# Patient Record
Sex: Female | Born: 1970 | Race: White | Hispanic: No | Marital: Married | Smoking: Never smoker
Health system: Southern US, Community
[De-identification: ages and names within clinical notes are randomized; demographics above are authoritative.]

## PROBLEM LIST (undated history)

## (undated) DIAGNOSIS — T7840XA Allergy, unspecified, initial encounter: Secondary | ICD-10-CM

## (undated) DIAGNOSIS — F419 Anxiety disorder, unspecified: Secondary | ICD-10-CM

## (undated) DIAGNOSIS — I10 Essential (primary) hypertension: Secondary | ICD-10-CM

## (undated) DIAGNOSIS — B379 Candidiasis, unspecified: Secondary | ICD-10-CM

## (undated) DIAGNOSIS — K219 Gastro-esophageal reflux disease without esophagitis: Secondary | ICD-10-CM

## (undated) DIAGNOSIS — D649 Anemia, unspecified: Secondary | ICD-10-CM

## (undated) DIAGNOSIS — F32A Depression, unspecified: Secondary | ICD-10-CM

## (undated) DIAGNOSIS — B019 Varicella without complication: Secondary | ICD-10-CM

## (undated) DIAGNOSIS — F329 Major depressive disorder, single episode, unspecified: Secondary | ICD-10-CM

## (undated) DIAGNOSIS — Z8719 Personal history of other diseases of the digestive system: Secondary | ICD-10-CM

## (undated) HISTORY — PX: WISDOM TOOTH EXTRACTION: SHX21

## (undated) HISTORY — DX: Allergy, unspecified, initial encounter: T78.40XA

## (undated) HISTORY — DX: Anemia, unspecified: D64.9

## (undated) HISTORY — DX: Personal history of other diseases of the digestive system: Z87.19

## (undated) HISTORY — DX: Candidiasis, unspecified: B37.9

## (undated) HISTORY — DX: Varicella without complication: B01.9

---

## 1998-03-20 ENCOUNTER — Other Ambulatory Visit: Admission: RE | Admit: 1998-03-20 | Discharge: 1998-03-20 | Payer: Self-pay | Admitting: Obstetrics and Gynecology

## 2000-04-15 ENCOUNTER — Other Ambulatory Visit: Admission: RE | Admit: 2000-04-15 | Discharge: 2000-04-15 | Payer: Self-pay | Admitting: Obstetrics and Gynecology

## 2000-10-29 ENCOUNTER — Inpatient Hospital Stay (HOSPITAL_COMMUNITY): Admission: AD | Admit: 2000-10-29 | Discharge: 2000-10-29 | Payer: Self-pay | Admitting: Obstetrics and Gynecology

## 2000-10-31 ENCOUNTER — Inpatient Hospital Stay (HOSPITAL_COMMUNITY): Admission: AD | Admit: 2000-10-31 | Discharge: 2000-11-03 | Payer: Self-pay | Admitting: Obstetrics and Gynecology

## 2000-11-13 ENCOUNTER — Encounter: Admission: RE | Admit: 2000-11-13 | Discharge: 2000-12-13 | Payer: Self-pay | Admitting: Obstetrics and Gynecology

## 2001-12-22 ENCOUNTER — Other Ambulatory Visit: Admission: RE | Admit: 2001-12-22 | Discharge: 2001-12-22 | Payer: Self-pay | Admitting: Obstetrics and Gynecology

## 2002-12-28 ENCOUNTER — Other Ambulatory Visit: Admission: RE | Admit: 2002-12-28 | Discharge: 2002-12-28 | Payer: Self-pay | Admitting: Obstetrics and Gynecology

## 2004-03-22 ENCOUNTER — Other Ambulatory Visit: Admission: RE | Admit: 2004-03-22 | Discharge: 2004-03-22 | Payer: Self-pay | Admitting: Obstetrics and Gynecology

## 2005-04-10 ENCOUNTER — Other Ambulatory Visit: Admission: RE | Admit: 2005-04-10 | Discharge: 2005-04-10 | Payer: Self-pay | Admitting: Obstetrics and Gynecology

## 2006-03-31 ENCOUNTER — Encounter: Admission: RE | Admit: 2006-03-31 | Discharge: 2006-03-31 | Payer: Self-pay | Admitting: Internal Medicine

## 2006-04-04 DIAGNOSIS — Z8719 Personal history of other diseases of the digestive system: Secondary | ICD-10-CM

## 2006-04-04 HISTORY — DX: Personal history of other diseases of the digestive system: Z87.19

## 2006-04-17 ENCOUNTER — Other Ambulatory Visit: Admission: RE | Admit: 2006-04-17 | Discharge: 2006-04-17 | Payer: Self-pay | Admitting: Obstetrics and Gynecology

## 2011-09-23 DIAGNOSIS — N882 Stricture and stenosis of cervix uteri: Secondary | ICD-10-CM

## 2011-10-01 ENCOUNTER — Encounter (HOSPITAL_COMMUNITY): Payer: Self-pay

## 2011-10-01 ENCOUNTER — Emergency Department (HOSPITAL_COMMUNITY)
Admission: EM | Admit: 2011-10-01 | Discharge: 2011-10-01 | Disposition: A | Discharge status: Home / Self Care | Payer: No Typology Code available for payment source | Attending: Emergency Medicine | Admitting: Emergency Medicine

## 2011-10-01 DIAGNOSIS — S139XXA Sprain of joints and ligaments of unspecified parts of neck, initial encounter: Secondary | ICD-10-CM | POA: Insufficient documentation

## 2011-10-01 DIAGNOSIS — S161XXA Strain of muscle, fascia and tendon at neck level, initial encounter: Secondary | ICD-10-CM

## 2011-10-01 HISTORY — DX: Anxiety disorder, unspecified: F41.9

## 2011-10-01 HISTORY — DX: Major depressive disorder, single episode, unspecified: F32.9

## 2011-10-01 HISTORY — DX: Depression, unspecified: F32.A

## 2011-10-01 MED ORDER — DIAZEPAM 5 MG PO TABS: 5.0000 mg | ORAL_TABLET | Freq: Three times a day (TID) | ORAL | Status: AC | PRN

## 2011-10-01 MED ORDER — IBUPROFEN 800 MG PO TABS: 800.0000 mg | ORAL_TABLET | Freq: Three times a day (TID) | ORAL | Status: AC
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
## 2011-10-01 NOTE — ED Provider Notes (Signed)
Medical screening examination/treatment/procedure(s) were performed by non-physician practitioner and as supervising physician I was immediately available for consultation/collaboration. Devoria Albe, MD, Armando Gang   Ward Givens, MD 10/01/11 [phone]
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
## 2011-10-22 ENCOUNTER — Ambulatory Visit (INDEPENDENT_AMBULATORY_CARE_PROVIDER_SITE_OTHER): Payer: BC Managed Care – PPO | Admitting: Obstetrics and Gynecology

## 2011-10-22 DIAGNOSIS — Z01419 Encounter for gynecological examination (general) (routine) without abnormal findings: Secondary | ICD-10-CM

## 2011-11-05 ENCOUNTER — Ambulatory Visit: Payer: Self-pay | Admitting: Obstetrics and Gynecology

## 2011-12-08 ENCOUNTER — Ambulatory Visit: Payer: BC Managed Care – HMO

## 2011-12-09 ENCOUNTER — Ambulatory Visit: Payer: BC Managed Care – HMO | Attending: Family Medicine | Admitting: Physical Therapy

## 2011-12-09 DIAGNOSIS — M542 Cervicalgia: Secondary | ICD-10-CM | POA: Insufficient documentation

## 2011-12-09 DIAGNOSIS — M25519 Pain in unspecified shoulder: Secondary | ICD-10-CM | POA: Insufficient documentation

## 2011-12-09 DIAGNOSIS — IMO0001 Reserved for inherently not codable concepts without codable children: Secondary | ICD-10-CM | POA: Insufficient documentation

## 2011-12-09 DIAGNOSIS — M25539 Pain in unspecified wrist: Secondary | ICD-10-CM | POA: Insufficient documentation

## 2011-12-16 ENCOUNTER — Ambulatory Visit: Payer: BC Managed Care – HMO | Admitting: Physical Therapy

## 2011-12-17 ENCOUNTER — Encounter: Payer: BC Managed Care – HMO | Admitting: Physical Therapy

## 2011-12-23 ENCOUNTER — Ambulatory Visit: Payer: BC Managed Care – HMO | Admitting: Physical Therapy

## 2011-12-25 ENCOUNTER — Ambulatory Visit: Payer: BC Managed Care – HMO | Admitting: Physical Therapy

## 2011-12-30 ENCOUNTER — Ambulatory Visit: Payer: BC Managed Care – HMO | Admitting: Physical Therapy

## 2012-01-01 ENCOUNTER — Ambulatory Visit: Payer: BC Managed Care – HMO | Admitting: Physical Therapy

## 2012-01-06 ENCOUNTER — Ambulatory Visit: Payer: No Typology Code available for payment source | Attending: Family Medicine | Admitting: Physical Therapy

## 2012-01-06 DIAGNOSIS — M542 Cervicalgia: Secondary | ICD-10-CM | POA: Insufficient documentation

## 2012-01-06 DIAGNOSIS — M25539 Pain in unspecified wrist: Secondary | ICD-10-CM | POA: Insufficient documentation

## 2012-01-06 DIAGNOSIS — M25519 Pain in unspecified shoulder: Secondary | ICD-10-CM | POA: Insufficient documentation

## 2012-01-06 DIAGNOSIS — IMO0001 Reserved for inherently not codable concepts without codable children: Secondary | ICD-10-CM | POA: Insufficient documentation

## 2012-01-08 ENCOUNTER — Ambulatory Visit: Payer: No Typology Code available for payment source | Admitting: Physical Therapy

## 2012-01-13 ENCOUNTER — Ambulatory Visit: Payer: No Typology Code available for payment source | Admitting: Physical Therapy

## 2012-01-15 ENCOUNTER — Ambulatory Visit: Payer: No Typology Code available for payment source | Admitting: Physical Therapy

## 2012-01-20 ENCOUNTER — Ambulatory Visit: Payer: No Typology Code available for payment source | Admitting: Physical Therapy

## 2012-01-22 ENCOUNTER — Ambulatory Visit: Payer: No Typology Code available for payment source | Admitting: Physical Therapy

## 2012-02-26 ENCOUNTER — Ambulatory Visit: Payer: BC Managed Care – HMO | Attending: Family Medicine | Admitting: Physical Therapy

## 2012-02-26 DIAGNOSIS — M25539 Pain in unspecified wrist: Secondary | ICD-10-CM | POA: Insufficient documentation

## 2012-02-26 DIAGNOSIS — IMO0001 Reserved for inherently not codable concepts without codable children: Secondary | ICD-10-CM | POA: Insufficient documentation

## 2012-02-26 DIAGNOSIS — M25519 Pain in unspecified shoulder: Secondary | ICD-10-CM | POA: Insufficient documentation

## 2012-02-26 DIAGNOSIS — M542 Cervicalgia: Secondary | ICD-10-CM | POA: Insufficient documentation

## 2012-03-04 ENCOUNTER — Ambulatory Visit: Payer: BC Managed Care – HMO | Attending: Family Medicine | Admitting: Physical Therapy

## 2012-03-04 DIAGNOSIS — IMO0001 Reserved for inherently not codable concepts without codable children: Secondary | ICD-10-CM | POA: Insufficient documentation

## 2012-03-04 DIAGNOSIS — M542 Cervicalgia: Secondary | ICD-10-CM | POA: Insufficient documentation

## 2012-03-04 DIAGNOSIS — M25519 Pain in unspecified shoulder: Secondary | ICD-10-CM | POA: Insufficient documentation

## 2012-03-04 DIAGNOSIS — M25539 Pain in unspecified wrist: Secondary | ICD-10-CM | POA: Insufficient documentation

## 2012-03-09 ENCOUNTER — Ambulatory Visit: Payer: BC Managed Care – HMO | Admitting: Physical Therapy

## 2012-03-11 ENCOUNTER — Encounter: Payer: BC Managed Care – HMO | Admitting: Physical Therapy

## 2012-03-18 ENCOUNTER — Ambulatory Visit: Payer: BC Managed Care – HMO | Admitting: Physical Therapy

## 2012-03-23 ENCOUNTER — Encounter: Payer: BC Managed Care – HMO | Admitting: Physical Therapy

## 2012-03-25 ENCOUNTER — Encounter: Payer: BC Managed Care – HMO | Admitting: Physical Therapy

## 2012-03-30 ENCOUNTER — Ambulatory Visit: Payer: BC Managed Care – HMO | Admitting: Physical Therapy

## 2012-04-01 ENCOUNTER — Encounter: Payer: BC Managed Care – HMO | Admitting: Physical Therapy

## 2012-04-01 ENCOUNTER — Ambulatory Visit: Payer: BC Managed Care – HMO | Admitting: Physical Therapy

## 2012-04-07 ENCOUNTER — Ambulatory Visit: Payer: BC Managed Care – HMO | Attending: Family Medicine | Admitting: Physical Therapy

## 2012-04-07 DIAGNOSIS — M25519 Pain in unspecified shoulder: Secondary | ICD-10-CM | POA: Insufficient documentation

## 2012-04-07 DIAGNOSIS — M25539 Pain in unspecified wrist: Secondary | ICD-10-CM | POA: Insufficient documentation

## 2012-04-07 DIAGNOSIS — IMO0001 Reserved for inherently not codable concepts without codable children: Secondary | ICD-10-CM | POA: Insufficient documentation

## 2012-04-07 DIAGNOSIS — M542 Cervicalgia: Secondary | ICD-10-CM | POA: Insufficient documentation

## 2012-04-08 ENCOUNTER — Encounter: Payer: BC Managed Care – HMO | Admitting: Physical Therapy

## 2012-04-12 ENCOUNTER — Encounter: Payer: BC Managed Care – HMO | Admitting: Physical Therapy

## 2012-04-15 ENCOUNTER — Ambulatory Visit: Payer: BC Managed Care – HMO | Admitting: Physical Therapy

## 2012-04-20 ENCOUNTER — Other Ambulatory Visit: Payer: Self-pay | Admitting: Family Medicine

## 2012-04-20 DIAGNOSIS — M542 Cervicalgia: Secondary | ICD-10-CM

## 2012-04-21 ENCOUNTER — Ambulatory Visit
Admission: RE | Admit: 2012-04-21 | Discharge: 2012-04-21 | Disposition: A | Discharge status: Home / Self Care | Payer: BC Managed Care – HMO | Source: Ambulatory Visit | Attending: Family Medicine | Admitting: Family Medicine

## 2012-04-21 DIAGNOSIS — M542 Cervicalgia: Secondary | ICD-10-CM

## 2012-06-14 ENCOUNTER — Encounter (HOSPITAL_COMMUNITY): Payer: Self-pay | Admitting: Pharmacy Technician

## 2012-06-14 ENCOUNTER — Other Ambulatory Visit (HOSPITAL_COMMUNITY): Payer: Self-pay | Admitting: Orthopaedic Surgery

## 2012-06-15 ENCOUNTER — Encounter (HOSPITAL_COMMUNITY): Payer: Self-pay

## 2012-06-15 ENCOUNTER — Encounter (HOSPITAL_COMMUNITY)
Admission: RE | Admit: 2012-06-15 | Discharge: 2012-06-15 | Disposition: A | Discharge status: Home / Self Care | Payer: BC Managed Care – PPO | Source: Ambulatory Visit | Attending: Orthopaedic Surgery | Admitting: Orthopaedic Surgery

## 2012-06-15 ENCOUNTER — Encounter (HOSPITAL_COMMUNITY)
Admission: RE | Admit: 2012-06-15 | Discharge: 2012-06-15 | Disposition: A | Discharge status: Home / Self Care | Payer: BC Managed Care – PPO | Source: Ambulatory Visit | Attending: Anesthesiology | Admitting: Anesthesiology

## 2012-06-15 HISTORY — DX: Essential (primary) hypertension: I10

## 2012-06-15 HISTORY — DX: Gastro-esophageal reflux disease without esophagitis: K21.9

## 2012-06-15 LAB — URINALYSIS, ROUTINE W REFLEX MICROSCOPIC
Glucose, UA: NEGATIVE mg/dL
Leukocytes, UA: NEGATIVE
Protein, ur: NEGATIVE mg/dL

## 2012-06-15 LAB — CBC
MCH: 30.3 pg (ref 26.0–34.0)
MCHC: 33.6 g/dL (ref 30.0–36.0)
Platelets: 318 10*3/uL (ref 150–400)
RDW: 12.1 % (ref 11.5–15.5)

## 2012-06-15 LAB — HCG, SERUM, QUALITATIVE: Preg, Serum: NEGATIVE

## 2012-06-15 LAB — COMPREHENSIVE METABOLIC PANEL
ALT: 18 U/L (ref 0–35)
AST: 17 U/L (ref 0–37)
Calcium: 9.6 mg/dL (ref 8.4–10.5)
GFR calc Af Amer: >90 mL/min (ref >90)
Sodium: 141 mEq/L (ref 135–145)
Total Protein: 7 g/dL (ref 6.0–8.3)

## 2012-06-15 LAB — SURGICAL PCR SCREEN: MRSA, PCR: NEGATIVE

## 2012-06-15 NOTE — Pre-Procedure Instructions (Addendum)
20 HALLEL DENHERDER  06/15/2012   Your procedure is scheduled on:  08/22/11  Report to [facility] at1030 AM.  Call this number if you have problems the morning of surgery: [phone]   Remember:   Do not eat food or drinkAfter Midnight.    Take these medicines the morning of surgery with A SIP OF WATER: pain med, celexa, clonidine  STOP cinnamon,flaxseed,naproxen now   Do not wear jewelry, make-up or nail polish.  Do not wear lotions, powders, or perfumes  Do not shave 48 hours prior to surgery. Men may shave face and neck.  Do not bring valuables to the hospital.  Contacts, dentures or bridgework may not be worn into surgery.  Leave suitcase in the car. After surgery it may be brought to your room.  For patients admitted to the hospital, checkout time is 11:00 AM the day of discharge.   Patients discharged the day of surgery will not be allowed to drive home.  Name and phone number of your driver: [phone] brian spouse  Special Instructions: Shower using CHG 2 nights before surgery and the night before surgery.  If you shower the day of surgery use CHG.  Use special wash - you have one bottle of CHG for all showers.  You should use approximately 1/3 of the bottle for each shower.   Please read over the following fact sheets that you were given: Pain Booklet, Coughing and Deep Breathing, MRSA Information and Surgical Site Infection Prevention
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
## 2012-06-20 MED ORDER — CEFAZOLIN SODIUM-DEXTROSE 2-3 GM-% IV SOLR
2.0000 g | INTRAVENOUS | Status: AC
  Administered 2012-06-21: 2 g via INTRAVENOUS
  Filled 2012-06-20: qty 50

## 2012-06-21 ENCOUNTER — Encounter (HOSPITAL_COMMUNITY): Payer: Self-pay | Admitting: Certified Registered"

## 2012-06-21 ENCOUNTER — Ambulatory Visit (HOSPITAL_COMMUNITY): Payer: BC Managed Care – PPO

## 2012-06-21 ENCOUNTER — Ambulatory Visit (HOSPITAL_COMMUNITY): Payer: BC Managed Care – PPO | Admitting: Certified Registered"

## 2012-06-21 ENCOUNTER — Encounter (HOSPITAL_COMMUNITY)
Admission: RE | Disposition: A | Discharge status: Home / Self Care | Payer: Self-pay | Source: Ambulatory Visit | Attending: Orthopaedic Surgery

## 2012-06-21 ENCOUNTER — Observation Stay (HOSPITAL_COMMUNITY)
Admission: RE | Admit: 2012-06-21 | Discharge: 2012-06-22 | DRG: 865 | Disposition: A | Discharge status: Home / Self Care | Payer: BC Managed Care – PPO | Source: Ambulatory Visit | Attending: Orthopaedic Surgery | Admitting: Orthopaedic Surgery

## 2012-06-21 DIAGNOSIS — I10 Essential (primary) hypertension: Secondary | ICD-10-CM | POA: Insufficient documentation

## 2012-06-21 DIAGNOSIS — Z01818 Encounter for other preprocedural examination: Secondary | ICD-10-CM | POA: Insufficient documentation

## 2012-06-21 DIAGNOSIS — Z01812 Encounter for preprocedural laboratory examination: Secondary | ICD-10-CM | POA: Insufficient documentation

## 2012-06-21 DIAGNOSIS — Z01810 Encounter for preprocedural cardiovascular examination: Secondary | ICD-10-CM | POA: Insufficient documentation

## 2012-06-21 DIAGNOSIS — M47812 Spondylosis without myelopathy or radiculopathy, cervical region: Principal | ICD-10-CM | POA: Diagnosis present

## 2012-06-21 HISTORY — PX: ANTERIOR CERVICAL DECOMP/DISCECTOMY FUSION: SHX1161

## 2012-06-21 SURGERY — ANTERIOR CERVICAL DECOMPRESSION/DISCECTOMY FUSION 2 LEVELS
Anesthesia: General | Site: Neck | Wound class: Clean

## 2012-06-21 MED ORDER — PHENOL 1.4 % MT LIQD: 1.0000 | OROMUCOSAL | Status: DC | PRN

## 2012-06-21 MED ORDER — MENTHOL 3 MG MT LOZG: 1.0000 | LOZENGE | OROMUCOSAL | Status: DC | PRN

## 2012-06-21 MED ORDER — NEOSTIGMINE METHYLSULFATE 1 MG/ML IJ SOLN
INTRAMUSCULAR | Status: DC | PRN
  Administered 2012-06-21: 4 mg via INTRAVENOUS

## 2012-06-21 MED ORDER — SENNOSIDES-DOCUSATE SODIUM 8.6-50 MG PO TABS: 1.0000 | ORAL_TABLET | Freq: Every evening | ORAL | Status: DC | PRN

## 2012-06-21 MED ORDER — EPHEDRINE SULFATE 50 MG/ML IJ SOLN
INTRAMUSCULAR | Status: DC | PRN
  Administered 2012-06-21 (×2): 5 mg via INTRAVENOUS

## 2012-06-21 MED ORDER — 0.9 % SODIUM CHLORIDE (POUR BTL) OPTIME
TOPICAL | Status: DC | PRN
  Administered 2012-06-21: 1000 mL

## 2012-06-21 MED ORDER — CEFAZOLIN SODIUM 1-5 GM-% IV SOLN
1.0000 g | Freq: Three times a day (TID) | INTRAVENOUS | Status: AC
  Administered 2012-06-21 – 2012-06-22 (×2): 1 g via INTRAVENOUS
  Filled 2012-06-21 (×2): qty 50

## 2012-06-21 MED ORDER — OXYCODONE HCL 5 MG/5ML PO SOLN: 5.0000 mg | Freq: Once | ORAL | Status: DC | PRN

## 2012-06-21 MED ORDER — LEVONORGESTREL-ETHINYL ESTRAD 90-20 MCG PO TABS
1.0000 | ORAL_TABLET | Freq: Every day | ORAL | Status: DC
  Administered 2012-06-21 – 2012-06-22 (×2): 1 via ORAL

## 2012-06-21 MED ORDER — ZOLPIDEM TARTRATE 5 MG PO TABS: 5.0000 mg | ORAL_TABLET | Freq: Every evening | ORAL | Status: DC | PRN

## 2012-06-21 MED ORDER — SODIUM CHLORIDE 0.9 % IJ SOLN: 3.0000 mL | Freq: Two times a day (BID) | INTRAMUSCULAR | Status: DC

## 2012-06-21 MED ORDER — SODIUM CHLORIDE 0.9 % IV SOLN: 250.0000 mL | INTRAVENOUS | Status: DC

## 2012-06-21 MED ORDER — PROMETHAZINE HCL 25 MG/ML IJ SOLN: 6.2500 mg | INTRAMUSCULAR | Status: DC | PRN

## 2012-06-21 MED ORDER — OXYCODONE-ACETAMINOPHEN 5-325 MG PO TABS
1.0000 | ORAL_TABLET | ORAL | Status: DC | PRN
  Administered 2012-06-21 – 2012-06-22 (×3): 2 via ORAL
  Filled 2012-06-21 (×3): qty 2

## 2012-06-21 MED ORDER — SODIUM CHLORIDE 0.9 % IJ SOLN: 3.0000 mL | INTRAMUSCULAR | Status: DC | PRN

## 2012-06-21 MED ORDER — MEPERIDINE HCL 25 MG/ML IJ SOLN: 6.2500 mg | INTRAMUSCULAR | Status: DC | PRN

## 2012-06-21 MED ORDER — GLYCOPYRROLATE 0.2 MG/ML IJ SOLN
INTRAMUSCULAR | Status: DC | PRN
  Administered 2012-06-21: 0.6 mg via INTRAVENOUS

## 2012-06-21 MED ORDER — ACETAMINOPHEN 325 MG PO TABS: 650.0000 mg | ORAL_TABLET | ORAL | Status: DC | PRN

## 2012-06-21 MED ORDER — HYDROMORPHONE HCL PF 1 MG/ML IJ SOLN
INTRAMUSCULAR | Status: AC
  Administered 2012-06-21: 0.5 mg via INTRAVENOUS
  Filled 2012-06-21: qty 1

## 2012-06-21 MED ORDER — ONDANSETRON HCL 4 MG/2ML IJ SOLN
4.0000 mg | INTRAMUSCULAR | Status: DC | PRN
  Administered 2012-06-21: 4 mg via INTRAVENOUS
  Filled 2012-06-21: qty 2

## 2012-06-21 MED ORDER — BUPIVACAINE-EPINEPHRINE PF 0.25-1:200000 % IJ SOLN
INTRAMUSCULAR | Status: AC
  Filled 2012-06-21: qty 30

## 2012-06-21 MED ORDER — METHOCARBAMOL 500 MG PO TABS: 500.0000 mg | ORAL_TABLET | Freq: Four times a day (QID) | ORAL | Status: DC

## 2012-06-21 MED ORDER — METHOCARBAMOL 100 MG/ML IJ SOLN
500.0000 mg | Freq: Four times a day (QID) | INTRAVENOUS | Status: DC | PRN
  Filled 2012-06-21: qty 5

## 2012-06-21 MED ORDER — METHOCARBAMOL 500 MG PO TABS
500.0000 mg | ORAL_TABLET | Freq: Four times a day (QID) | ORAL | Status: DC | PRN
  Administered 2012-06-21 – 2012-06-22 (×2): 500 mg via ORAL
  Filled 2012-06-21 (×2): qty 1

## 2012-06-21 MED ORDER — DEXAMETHASONE SODIUM PHOSPHATE 4 MG/ML IJ SOLN
INTRAMUSCULAR | Status: DC | PRN
  Administered 2012-06-21: 4 mg via INTRAVENOUS

## 2012-06-21 MED ORDER — PROPOFOL 10 MG/ML IV BOLUS
INTRAVENOUS | Status: DC | PRN
  Administered 2012-06-21: 200 mg via INTRAVENOUS

## 2012-06-21 MED ORDER — CLONIDINE HCL 0.1 MG PO TABS
0.1000 mg | ORAL_TABLET | Freq: Every day | ORAL | Status: DC
Start: 2012-06-21 — End: 2012-06-22
  Administered 2012-06-21 – 2012-06-22 (×2): 0.1 mg via ORAL
  Filled 2012-06-21 (×2): qty 1

## 2012-06-21 MED ORDER — MORPHINE SULFATE 2 MG/ML IJ SOLN: 1.0000 mg | INTRAMUSCULAR | Status: DC | PRN

## 2012-06-21 MED ORDER — METHOCARBAMOL 100 MG/ML IJ SOLN
500.0000 mg | INTRAVENOUS | Status: AC
  Administered 2012-06-21: 500 mg via INTRAVENOUS
  Filled 2012-06-21: qty 5

## 2012-06-21 MED ORDER — ACETAMINOPHEN 650 MG RE SUPP: 650.0000 mg | RECTAL | Status: DC | PRN

## 2012-06-21 MED ORDER — HYDROMORPHONE HCL PF 1 MG/ML IJ SOLN
INTRAMUSCULAR | Status: AC
  Filled 2012-06-21: qty 1

## 2012-06-21 MED ORDER — KCL IN DEXTROSE-NACL 20-5-0.45 MEQ/L-%-% IV SOLN
INTRAVENOUS | Status: DC
  Administered 2012-06-21: 75 mL/h via INTRAVENOUS
  Filled 2012-06-21 (×2): qty 1000

## 2012-06-21 MED ORDER — BUPIVACAINE-EPINEPHRINE 0.25% -1:200000 IJ SOLN
INTRAMUSCULAR | Status: DC | PRN
  Administered 2012-06-21: 6 mL

## 2012-06-21 MED ORDER — LACTATED RINGERS IV SOLN
INTRAVENOUS | Status: DC | PRN
  Administered 2012-06-21 (×3): via INTRAVENOUS

## 2012-06-21 MED ORDER — PANTOPRAZOLE SODIUM 40 MG IV SOLR
40.0000 mg | Freq: Every day | INTRAVENOUS | Status: DC
  Administered 2012-06-21: 40 mg via INTRAVENOUS
  Filled 2012-06-21 (×2): qty 40

## 2012-06-21 MED ORDER — MIDAZOLAM HCL 5 MG/5ML IJ SOLN
INTRAMUSCULAR | Status: DC | PRN
  Administered 2012-06-21: 2 mg via INTRAVENOUS

## 2012-06-21 MED ORDER — HYDROCODONE-ACETAMINOPHEN 5-325 MG PO TABS
1.0000 | ORAL_TABLET | ORAL | Status: DC | PRN
  Administered 2012-06-21: 2 via ORAL
  Filled 2012-06-21: qty 2

## 2012-06-21 MED ORDER — LIDOCAINE HCL (CARDIAC) 20 MG/ML IV SOLN
INTRAVENOUS | Status: DC | PRN
  Administered 2012-06-21: 30 mg via INTRAVENOUS

## 2012-06-21 MED ORDER — FLEET ENEMA 7-19 GM/118ML RE ENEM: 1.0000 | ENEMA | Freq: Once | RECTAL | Status: AC | PRN

## 2012-06-21 MED ORDER — ARTIFICIAL TEARS OP OINT
TOPICAL_OINTMENT | OPHTHALMIC | Status: DC | PRN
  Administered 2012-06-21: 1 via OPHTHALMIC

## 2012-06-21 MED ORDER — LACTATED RINGERS IV SOLN
INTRAVENOUS | Status: DC
  Administered 2012-06-21: 12:00:00 via INTRAVENOUS

## 2012-06-21 MED ORDER — MIDAZOLAM HCL 2 MG/2ML IJ SOLN: 0.5000 mg | Freq: Once | INTRAMUSCULAR | Status: DC | PRN

## 2012-06-21 MED ORDER — FENTANYL CITRATE 0.05 MG/ML IJ SOLN
INTRAMUSCULAR | Status: DC | PRN
  Administered 2012-06-21: 100 ug via INTRAVENOUS
  Administered 2012-06-21 (×8): 50 ug via INTRAVENOUS

## 2012-06-21 MED ORDER — OXYCODONE-ACETAMINOPHEN 5-325 MG PO TABS: ORAL_TABLET | ORAL | Status: DC

## 2012-06-21 MED ORDER — HYDROMORPHONE HCL PF 1 MG/ML IJ SOLN
0.2500 mg | INTRAMUSCULAR | Status: DC | PRN
  Administered 2012-06-21 (×2): 0.5 mg via INTRAVENOUS

## 2012-06-21 MED ORDER — CITALOPRAM HYDROBROMIDE 10 MG PO TABS
10.0000 mg | ORAL_TABLET | Freq: Every day | ORAL | Status: DC
  Administered 2012-06-21 – 2012-06-22 (×2): 10 mg via ORAL
  Filled 2012-06-21 (×3): qty 1

## 2012-06-21 MED ORDER — DOCUSATE SODIUM 100 MG PO CAPS
100.0000 mg | ORAL_CAPSULE | Freq: Two times a day (BID) | ORAL | Status: DC
  Administered 2012-06-21 – 2012-06-22 (×2): 100 mg via ORAL
  Filled 2012-06-21 (×3): qty 1

## 2012-06-21 MED ORDER — KETOROLAC TROMETHAMINE 30 MG/ML IJ SOLN
30.0000 mg | Freq: Once | INTRAMUSCULAR | Status: AC
  Administered 2012-06-21: 30 mg via INTRAVENOUS
  Filled 2012-06-21: qty 1

## 2012-06-21 MED ORDER — THROMBIN 5000 UNITS EX SOLR
OROMUCOSAL | Status: DC | PRN
  Administered 2012-06-21: 14:00:00 via TOPICAL

## 2012-06-21 MED ORDER — ONDANSETRON HCL 4 MG/2ML IJ SOLN
INTRAMUSCULAR | Status: DC | PRN
  Administered 2012-06-21: 4 mg via INTRAVENOUS

## 2012-06-21 MED ORDER — BISACODYL 10 MG RE SUPP: 10.0000 mg | Freq: Every day | RECTAL | Status: DC | PRN

## 2012-06-21 MED ORDER — THROMBIN 5000 UNITS EX SOLR
CUTANEOUS | Status: AC
  Filled 2012-06-21: qty 5000

## 2012-06-21 MED ORDER — ROCURONIUM BROMIDE 100 MG/10ML IV SOLN
INTRAVENOUS | Status: DC | PRN
  Administered 2012-06-21: 10 mg via INTRAVENOUS
  Administered 2012-06-21: 50 mg via INTRAVENOUS
  Administered 2012-06-21 (×2): 10 mg via INTRAVENOUS

## 2012-06-21 MED ORDER — OXYCODONE HCL 5 MG PO TABS: 5.0000 mg | ORAL_TABLET | Freq: Once | ORAL | Status: DC | PRN

## 2012-06-21 SURGICAL SUPPLY — 63 items
ACIS DISTRACTION PIN 12MM ×2 IMPLANT
APL SKNCLS STERI-STRIP NONHPOA (GAUZE/BANDAGES/DRESSINGS) ×1
BENZOIN TINCTURE PRP APPL 2/3 (GAUZE/BANDAGES/DRESSINGS) ×3 IMPLANT
BIT DRILL SRG 14X2.2XFLT CHK (BIT) IMPLANT
BIT DRL SRG 14X2.2XFLT CHK (BIT) ×1
BLADE SURG ROTATE 9660 (MISCELLANEOUS) IMPLANT
BUR ROUND FLUTED 4 SOFT TCH (BURR) ×1 IMPLANT
CLOTH BEACON ORANGE TIMEOUT ST (SAFETY) ×2 IMPLANT
CLSR STERI-STRIP ANTIMIC 1/2X4 (GAUZE/BANDAGES/DRESSINGS) ×1 IMPLANT
COLLAR CERV LO CONTOUR FIRM DE (SOFTGOODS) ×2 IMPLANT
CORDS BIPOLAR (ELECTRODE) ×1 IMPLANT
COVER MAYO STAND STRL (DRAPES) ×1 IMPLANT
COVER SURGICAL LIGHT HANDLE (MISCELLANEOUS) ×3 IMPLANT
DRAPE C-ARM 42X72 X-RAY (DRAPES) ×2 IMPLANT
DRAPE MICROSCOPE LEICA (MISCELLANEOUS) ×2 IMPLANT
DRAPE PROXIMA HALF (DRAPES) ×2 IMPLANT
DRILL BIT SKYLINE 14MM (BIT) ×2
DURAPREP 6ML APPLICATOR 50/CS (WOUND CARE) ×2 IMPLANT
ELECT COATED BLADE 2.86 ST (ELECTRODE) ×2 IMPLANT
ELECT REM PT RETURN 9FT ADLT (ELECTROSURGICAL) ×2
ELECTRODE REM PT RTRN 9FT ADLT (ELECTROSURGICAL) ×1 IMPLANT
EVACUATOR 1/8 PVC DRAIN (DRAIN) ×3 IMPLANT
GAUZE XEROFORM 1X8 LF (GAUZE/BANDAGES/DRESSINGS) ×2 IMPLANT
GLOVE BIOGEL PI IND STRL 7.5 (GLOVE) ×1 IMPLANT
GLOVE BIOGEL PI IND STRL 8 (GLOVE) ×1 IMPLANT
GLOVE BIOGEL PI INDICATOR 7.5 (GLOVE) ×1
GLOVE BIOGEL PI INDICATOR 8 (GLOVE) ×1
GLOVE ECLIPSE 7.0 STRL STRAW (GLOVE) ×2 IMPLANT
GLOVE ORTHO TXT STRL SZ7.5 (GLOVE) ×3 IMPLANT
GOWN PREVENTION PLUS LG XLONG (DISPOSABLE) IMPLANT
GOWN PREVENTION PLUS XLARGE (GOWN DISPOSABLE) ×2 IMPLANT
GOWN STRL NON-REIN LRG LVL3 (GOWN DISPOSABLE) ×4 IMPLANT
HEAD HALTER (SOFTGOODS) ×2 IMPLANT
HEMOSTAT SURGICEL 2X14 (HEMOSTASIS) IMPLANT
KIT BASIN OR (CUSTOM PROCEDURE TRAY) ×2 IMPLANT
KIT ROOM TURNOVER OR (KITS) ×2 IMPLANT
MANIFOLD NEPTUNE II (INSTRUMENTS) ×2 IMPLANT
NDL 18GX1X1/2 (RX/OR ONLY) (NEEDLE) IMPLANT
NDL 25GX 5/8IN NON SAFETY (NEEDLE) ×1 IMPLANT
NEEDLE 18GX1X1/2 (RX/OR ONLY) (NEEDLE) ×2 IMPLANT
NEEDLE 25GX 5/8IN NON SAFETY (NEEDLE) ×2 IMPLANT
NS IRRIG 1000ML POUR BTL (IV SOLUTION) ×2 IMPLANT
PACK ORTHO CERVICAL (CUSTOM PROCEDURE TRAY) ×2 IMPLANT
PAD ARMBOARD 7.5X6 YLW CONV (MISCELLANEOUS) ×4 IMPLANT
PAD CAST 4YDX4 CTTN HI CHSV (CAST SUPPLIES) IMPLANT
PADDING CAST COTTON 4X4 STRL (CAST SUPPLIES) ×2
PATTIES SURGICAL .5 X.5 (GAUZE/BANDAGES/DRESSINGS) ×1 IMPLANT
PIN TEMP SKYLINE THREADED (PIN) ×1 IMPLANT
PLATE TWO LEVEL SKYLINE 30MM (Plate) ×1 IMPLANT
SCREW VAR SELF TAP SKYLINE 14M (Screw) ×6 IMPLANT
SPONGE GAUZE 4X4 12PLY (GAUZE/BANDAGES/DRESSINGS) ×2 IMPLANT
STAPLER VISISTAT 35W (STAPLE) ×1 IMPLANT
STRIP CLOSURE SKIN 1/2X4 (GAUZE/BANDAGES/DRESSINGS) ×2 IMPLANT
SURGIFLO TRUKIT (HEMOSTASIS) IMPLANT
SUT VIC AB 2-0 CT2 18 VCP726D (SUTURE) ×1 IMPLANT
SUT VIC AB 3-0 X1 27 (SUTURE) ×2 IMPLANT
SUT VICRYL 4-0 PS2 18IN ABS (SUTURE) ×4 IMPLANT
SYR 20ML ECCENTRIC (SYRINGE) ×1 IMPLANT
SYR 30ML SLIP (SYRINGE) ×2 IMPLANT
TAPE CLOTH SURG 4X10 WHT LF (GAUZE/BANDAGES/DRESSINGS) ×1 IMPLANT
TOWEL OR 17X24 6PK STRL BLUE (TOWEL DISPOSABLE) ×2 IMPLANT
TOWEL OR 17X26 10 PK STRL BLUE (TOWEL DISPOSABLE) ×2 IMPLANT
WATER STERILE IRR 1000ML POUR (IV SOLUTION) ×2 IMPLANT

## 2012-06-21 NOTE — Anesthesia Preprocedure Evaluation (Signed)
Anesthesia Evaluation  Patient identified by MRN, date of birth, ID band Patient awake    Reviewed: Allergy & Precautions, H&P , NPO status , Patient's Chart, lab work & pertinent test results  History of Anesthesia Complications Negative for: history of anesthetic complications  Airway Mallampati: I TM Distance: >3 FB Neck ROM: Full    Dental  (+) Teeth Intact and Dental Advisory Given   Pulmonary neg pulmonary ROS,  breath sounds clear to auscultation  Pulmonary exam normal       Cardiovascular hypertension, Pt. on medications Rhythm:Regular Rate:Normal     Neuro/Psych PSYCHIATRIC DISORDERS Anxiety Depression    GI/Hepatic Neg liver ROS, GERD-  Controlled,  Endo/Other  negative endocrine ROS  Renal/GU negative Renal ROS     Musculoskeletal   Abdominal (+) + obese,   Peds  Hematology negative hematology ROS (+)   Anesthesia Other Findings   Reproductive/Obstetrics Preg test 11/12 NEG                           Anesthesia Physical Anesthesia Plan  ASA: II  Anesthesia Plan: General   Post-op Pain Management:    Induction: Intravenous  Airway Management Planned: Oral ETT and Video Laryngoscope Planned  Additional Equipment:   Intra-op Plan:   Post-operative Plan: Extubation in OR  Informed Consent: I have reviewed the patients History and Physical, chart, labs and discussed the procedure including the risks, benefits and alternatives for the proposed anesthesia with the patient or authorized representative who has indicated his/her understanding and acceptance.   Dental advisory given  Plan Discussed with: CRNA and Surgeon  Anesthesia Plan Comments: (Plan routine monitors, GETA- VideoGlide (myelopathic))        Anesthesia Quick Evaluation

## 2012-06-21 NOTE — Brief Op Note (Cosign Needed)
06/21/2012  3:10 PM  PATIENT:  Olivia Riley  41 y.o. female  PRE-OPERATIVE DIAGNOSIS:  C5-6, C6-7 Spondylosis  POST-OPERATIVE DIAGNOSIS:  C5-6, C6-7 Spondylosis  PROCEDURE:  Procedure(s) (LRB) with comments: ANTERIOR CERVICAL DECOMPRESSION/DISCECTOMY FUSION 2 LEVELS (N/A) - C5-6, C6-7 Anterior  Cervical Discectomy and Fusion, Allograft, Plate  SURGEON:  Surgeon(s) and Role:    * Eldred Manges, MD - Primary  PHYSICIAN ASSISTANT: Maud Deed [facility]  ASSISTANTS: none   ANESTHESIA:   general  EBL:  Total I/O In: 2000 [I.V.:2000] Out: -   BLOOD ADMINISTERED:none  DRAINS: (1) Hemovact drain(s) in the anterior neck with  Suction Open   LOCAL MEDICATIONS USED:  MARCAINE     SPECIMEN:  No Specimen  DISPOSITION OF SPECIMEN:  N/A  COUNTS:  YES  TOURNIQUET:  * No tourniquets in log *  DICTATION: .Note written in EPIC  PLAN OF CARE: Admit to inpatient   PATIENT DISPOSITION:  PACU - hemodynamically stable.   Delay start of Pharmacological VTE agent (>24hrs) due to surgical blood loss or risk of bleeding: yes

## 2012-06-21 NOTE — Interval H&P Note (Signed)
History and Physical Interval Note:  06/21/2012 12:32 PM  Olivia Riley  has presented today for surgery, with the diagnosis of C5-6, C6-7 Spondylosis  The various methods of treatment have been discussed with the patient and family. After consideration of risks, benefits and other options for treatment, the patient has consented to  Procedure(s) (LRB) with comments: ANTERIOR CERVICAL DECOMPRESSION/DISCECTOMY FUSION 2 LEVELS (N/A) - C5-6, C6-7 Anterior  Cervical Discectomy and Fusion, Allograft, Plate as a surgical intervention .  The patient's history has been reviewed, patient examined, no change in status, stable for surgery.  I have reviewed the patient's chart and labs.  Questions were answered to the patient's satisfaction.     [name] C

## 2012-06-21 NOTE — Transfer of Care (Signed)
Immediate Anesthesia Transfer of Care Note  Patient: Olivia Riley  Procedure(s) Performed: Procedure(s) (LRB) with comments: ANTERIOR CERVICAL DECOMPRESSION/DISCECTOMY FUSION 2 LEVELS (N/A) - C5-6, C6-7 Anterior  Cervical Discectomy and Fusion, Allograft, Plate  Patient Location: PACU  Anesthesia Type:General  Level of Consciousness: awake, oriented and patient cooperative  Airway & Oxygen Therapy: Patient Spontanous Breathing and Patient connected to nasal cannula oxygen  Post-op Assessment: Report given to PACU RN, Post -op Vital signs reviewed and stable and Patient moving all extremities X 4  Post vital signs: Reviewed and stable  Complications: No apparent anesthesia complications

## 2012-06-22 ENCOUNTER — Encounter (HOSPITAL_COMMUNITY): Payer: Self-pay | Admitting: Orthopaedic Surgery

## 2012-06-22 NOTE — Op Note (Signed)
NAMESHAWNIE, NICOLE             ACCOUNT NO.:  [id number]  MEDICAL RECORD NO.:  [id number]  LOCATION:  5N19C                        FACILITY:  [facility]  PHYSICIAN:  Keymani Glynn C. Ophelia Charter, M.D.    DATE OF BIRTH:  Feb 01, 1971  DATE OF PROCEDURE:  06/21/2012 DATE OF DISCHARGE:                              OPERATIVE REPORT   PREOPERATIVE DIAGNOSIS:  Cervical spondylosis, C5-6, C6-7.  POSTOPERATIVE DIAGNOSIS:  Cervical spondylosis, C5-6, C6-7.  PROCEDURE:  C5-6, C6-7 anterior cervical diskectomy and fusion, allograft and plate.  SURGEON:  Delane Wessinger C. Ophelia Charter, M.D. ASSISTANT;  Maud Deed PA -C medically necessary and present for the entire procedure.   EBL:  Minimal.  DRAINS:  One Hemovac neck.  COMPONENTS:  DePuy 32 mm plate with 14 mm screws x6.  Cortical cancellous allograft.  PROCEDURE:  After induction of general anesthesia, orotracheal intubation, standard prepping with head halter traction, harness applied without weight.  Sterile Mayo stand at the head.  The area had been prepped with DuraPrep, the area squared with towels, sterile skin marker on prominent skin fold and Betadine, Steri-Drape application.  Time-out procedure was completed.  Ancef was given prophylactically.  Incision was made starting at the midline extending to the left.  Subperiosteal dissection between the longus coli was performed once the platysma was divided and blunt dissection down inferior to the omohyoid down to the large noted at C6-7 and then exposure at C5-6. C5-6 had some anterior spurs not as prominent at C6-7 level.  Short 25 needle was placed at C5- 6 confirmed with cross-table lateral C-arm x-ray that had been draped. Operative microscope was draped and brought in as the diskectomy was performed with a 15 scalpel blade, pituitary and Cloward curettes. After taking down the posterior longitudinal ligament pushing the dura spurs removed right and left.  Uncovertebral joints were stripped. Trial sizers  showed a fixed lordotic trial, gave good preparation, endplates were prepared with curette with bur on the corners to remove spurs and then hand rasp.  Cortical cancellous graft was inserted with traction applied by the CRNA, countersunk 2 mm.  Identical procedure was repeated at C6-7 level.  At that level, there was skin bulging disk. There was epidural bleeder in the right corner and some Gelfoam was placed.  With diskectomy performed, the space tended to collapse and distractor was placed, spread out to 8 mm from posterior longitudinal ligament.  Chunks of disks were removed.  Once the graft was inserted, distractor was removed.  Holes were filled with some bone wax. Initially, a 30 and then 32 mm plate was selected.  Screws were inserted.  The screws in C5-6 went back in just at the posterior cortex. Three views confirmed position of the graft and distal screws were appropriately positioned inferior to the top of the endplate and screw centrally in C6.  AP and lateral fluoroscopic picture showed that it was well centered in good position.  Instrument count and needle count was correct.  The patient tolerated the procedure well.  After irrigation, Hemovac was placed in and out technique using the trocar popped in line with the skin incision on the left.  Platysma closed with 3-0 Vicryl, 4-0 Vicryl subcuticular closure,  postop dressing, tape, and soft cervical collar for mobilization.     Akari Crysler C. Ophelia Charter, M.D.     MCY/MEDQ  D:  06/21/2012  T:  06/22/2012  Job:  [id number]

## 2012-06-22 NOTE — Anesthesia Postprocedure Evaluation (Signed)
  Anesthesia Post-op Note  Patient: Olivia Riley  Procedure(s) Performed: Procedure(s) (LRB) with comments: ANTERIOR CERVICAL DECOMPRESSION/DISCECTOMY FUSION 2 LEVELS (N/A) - C5-6, C6-7 Anterior  Cervical Discectomy and Fusion, Allograft, Plate  Patient Location: Nursing Unit  Anesthesia Type:General  Level of Consciousness: awake, alert  and oriented  Airway and Oxygen Therapy: Patient Spontanous Breathing  Post-op Pain: none  Post-op Assessment: Post-op Vital signs reviewed and Patient's Cardiovascular Status Stable  Post-op Vital Signs: stable  Complications: No apparent anesthesia complications

## 2012-06-22 NOTE — Progress Notes (Signed)
Orthopedic Tech Progress Note Patient Details:  Olivia Riley Sep 23, 1970 [id number] 2nd soft collar ordered for patient to wrap and wear in shower; delivered to patient before discharge Ortho Devices Type of Ortho Device: Soft collar Ortho Device/Splint Interventions: Ordered   Asia R Thompson 06/22/2012, 11:31 AM

## 2012-06-22 NOTE — Progress Notes (Signed)
Patient discharged this morning at 1115. RN completed discharge instructions, and medication instructions with patient before being discharged. Patients questions were answered. Patient ambulated to car with husband, per patient request. RN instructed patient to walk slowly and not twist or bend neck. Discharge paperwork and prescriptions sent home with patient.

## 2012-06-22 NOTE — Progress Notes (Signed)
UR COMPLETED  

## 2012-06-28 NOTE — Discharge Summary (Signed)
Physician Discharge Summary  Patient ID: Olivia Riley MRN: [id number] DOB/AGE: 1970/09/06 41 y.o.  Admit date: 06/21/2012 Discharge date: 06/28/2012  Admission Diagnoses:  Cervical spondylosis C5-6 anc C6-7  Discharge Diagnoses:  Principal Problem:  *Cervical spondylosis same as above  Past Medical History  Diagnosis Date  . Anxiety   . Depression   . History of rectal bleeding 04/2006    With bowel movement  . Anemia   . Chicken pox   . Yeast infection   . Hypertension   . GERD (gastroesophageal reflux disease)     occ    Surgeries: Procedure(s): ANTERIOR CERVICAL DECOMPRESSION/DISCECTOMY FUSION 2 LEVELS on 06/21/2012   Consultants (if any):    Discharged Condition: Improved  Hospital Course: Olivia Riley is an 41 y.o. female who was admitted 06/21/2012 with a diagnosis of Cervical spondylosis and went to the operating room on 06/21/2012 and underwent the above named procedures.    She was given perioperative antibiotics:  Anti-infectives     Start     Dose/Rate Route Frequency Ordered Stop   06/21/12 1930   ceFAZolin (ANCEF) IVPB 1 g/50 mL premix        1 g 100 mL/hr over 30 Minutes Intravenous Every 8 hours 06/21/12 1756 06/22/12 0343   06/20/12 1524   ceFAZolin (ANCEF) IVPB 2 g/50 mL premix        2 g 100 mL/hr over 30 Minutes Intravenous 60 min pre-op 06/20/12 1524 06/21/12 1300        .  She was given sequential compression devices, early ambulation for DVT prophylaxis.  She benefited maximally from the hospital stay and there were no complications.    Recent vital signs:  Filed Vitals:   06/22/12 0613  BP: 124/84  Pulse: 92  Temp: 98.3 F (36.8 C)  Resp: 16    Recent laboratory studies:  Lab Results  Component Value Date   HGB 14.4 06/15/2012   Lab Results  Component Value Date   WBC 10.8* 06/15/2012   PLT 318 06/15/2012   Lab Results  Component Value Date   INR 0.99 06/15/2012   Lab Results  Component Value Date   NA  141 06/15/2012   K 3.6 06/15/2012   CL 104 06/15/2012   CO2 26 06/15/2012   BUN 11 06/15/2012   CREATININE 0.69 06/15/2012   GLUCOSE 153* 06/15/2012    Discharge Medications:     Medication List     As of 06/28/2012 12:48 PM    STOP taking these medications         HYDROcodone-acetaminophen 5-325 MG per tablet   Commonly known as: NORCO/VICODIN      naproxen sodium 220 MG tablet   Commonly known as: ANAPROX      TAKE these medications         CALCIUM-VITAMIN D PO   Take 1 tablet by mouth daily.      CINNAMON PO   Take 1,000 mg by mouth daily.      citalopram 10 MG tablet   Commonly known as: CELEXA   Take 10 mg by mouth daily.      cloNIDine 0.1 MG tablet   Commonly known as: CATAPRES   Take 0.1 mg by mouth daily.      Flax Seed Oil 1000 MG Caps   Take 1,000 mg by mouth daily.      levonorgestrel-ethinyl estradiol 90-20 MCG tablet   Commonly known as: LYBREL,AMETHYST   Take 1 tablet by mouth daily.  magnesium gluconate 500 MG tablet   Commonly known as: MAGONATE   Take 500 mg by mouth daily.      methocarbamol 500 MG tablet   Commonly known as: ROBAXIN   Take 1 tablet (500 mg total) by mouth 4 (four) times daily.      oxyCODONE-acetaminophen 5-325 MG per tablet   Commonly known as: PERCOCET/ROXICET   1-2 tabs q 4-6 hrs prn pain      Potassium 99 MG Tabs   Take 99 mg by mouth daily.        Diagnostic Studies: Dg Chest 2 View  06/15/2012  *RADIOLOGY REPORT*  Clinical Data: Hypertension  CHEST - 2 VIEW  Comparison: March 31, 2006  Findings: Lungs clear.  The heart size and pulmonary vascularity are normal.  No adenopathy.  No bone lesions. There is mild upper thoracic levoscoliosis.  IMPRESSION: Lungs clear.   Original Report Authenticated By: Bretta Bang, M.D.    Dg Cervical Spine 2-3 Views  06/21/2012  *RADIOLOGY REPORT*  Clinical Data: Neck pain  DG C-ARM 61-120 MIN,CERVICAL SPINE - 2-3 VIEW  Comparison: Multiple prior  Findings:  C-arm  films document C5-C7 ACDF.  Grossly satisfactory position and alignment.  IMPRESSION: As above.   Original Report Authenticated By: Davonna Belling, M.D.    Dg C-arm 61-120 Min  06/21/2012  *RADIOLOGY REPORT*  Clinical Data: Neck pain  DG C-ARM 61-120 MIN,CERVICAL SPINE - 2-3 VIEW  Comparison: Multiple prior  Findings:  C-arm films document C5-C7 ACDF.  Grossly satisfactory position and alignment.  IMPRESSION: As above.   Original Report Authenticated By: Davonna Belling, M.D.     Disposition: 01-Home or Self Care  No lifting greater than 10 lbs. No overhead use of arms. Avoid bending,and twisting neck. Walk in house for first week them may start to get out slowly increasing distance up to one mile by 3 weeks post op. Keep incision dry and change dressing daily or as needed Call if any fevers >101, chills, or increasing numbness or weakness or increased swelling or drainage.       Follow-up Information    Follow up with Eldred Manges, MD. Schedule an appointment as soon as possible for a visit in 2 weeks.   Contact information:   [address] [place] [address] [phone]           Signed: Wende Neighbors 06/28/2012, 12:48 PM

## 2013-04-25 ENCOUNTER — Other Ambulatory Visit: Payer: Self-pay | Admitting: Family

## 2013-04-25 DIAGNOSIS — E049 Nontoxic goiter, unspecified: Secondary | ICD-10-CM

## 2013-05-03 ENCOUNTER — Ambulatory Visit
Admission: RE | Admit: 2013-05-03 | Discharge: 2013-05-03 | Disposition: A | Discharge status: Home / Self Care | Payer: BC Managed Care – HMO | Source: Ambulatory Visit | Attending: Family | Admitting: Family

## 2013-05-03 DIAGNOSIS — E049 Nontoxic goiter, unspecified: Secondary | ICD-10-CM

## 2013-11-01 ENCOUNTER — Encounter: Payer: Self-pay | Admitting: Podiatry

## 2013-11-01 ENCOUNTER — Ambulatory Visit (INDEPENDENT_AMBULATORY_CARE_PROVIDER_SITE_OTHER): Payer: BC Managed Care – PPO | Admitting: Podiatry

## 2013-11-01 VITALS — BP 138/93 | HR 105 | Resp 15 | Ht 67.0 in | Wt 190.0 lb

## 2013-11-01 DIAGNOSIS — L608 Other nail disorders: Secondary | ICD-10-CM

## 2013-11-01 DIAGNOSIS — B351 Tinea unguium: Secondary | ICD-10-CM

## 2013-11-01 NOTE — Progress Notes (Signed)
   Subjective:    Patient ID: Olivia Riley, female    DOB: 06/05/1971, 43 y.o.   MRN: [id number]  HPI N fungal toenails        L B/L 1 toenails        D and O years over 10        C thick, discoloration and pain        A hx of injury        T Lamisil 60 days    Review of Systems  Constitutional: Negative.   HENT: Negative.   Eyes: Negative.   Respiratory: Negative.   Cardiovascular: Negative.   Gastrointestinal: Negative.   Endocrine: Negative.   Genitourinary: Negative.   Musculoskeletal: Negative.   Skin:       Hives   Allergic/Immunologic:       Hives with increase WBC - no known cause  Neurological: Negative.   Hematological: Negative.   Psychiatric/Behavioral: Negative.        Objective:   Physical Exam:. I have reviewed her past medical history medications allergies surgeries social history and review of systems. Pulses are strongly palpable bilateral. Neurologic sensorium is intact. Deep tendon reflexes are intact bilateral. Muscle strength is 5 over 5 dorsiflexors plantar flexors inverters everters all intrinsic musculature is intact. Orthopedic evaluation demonstrates all joints distal to the ankle a full range of motion without crepitus with exception of hammertoe deformity #2 of the right foot. Cutaneous evaluation does demonstrates nail dystrophy second digit right foot suspected onychomycosis hallux bilateral and second digit left foot.        Assessment & Plan:  Assessment: Nail dystrophy care rule out onychomycosis and tinea pedis  Plan: Discussed etiology pathology conservative versus surgical therapies. Take samples of the nail plates today sent for pathology evaluation and culture. Followup with her once those come in.

## 2013-11-02 NOTE — Progress Notes (Signed)
B/L 1st toenail fragments sent to [facility] for definitive diagnosis of fungal element. 

## 2013-11-21 ENCOUNTER — Telehealth: Payer: Self-pay | Admitting: *Deleted

## 2013-11-21 NOTE — Telephone Encounter (Signed)
Never received a call back about an appointment I had about a week or so ago.  Call me back.  I returned her call and left her a message that we have not received the final culture results yet.  Once we receive them and Dr. Al CorpusHyatt reviews it we'll give her a call.

## 2013-11-22 ENCOUNTER — Telehealth: Payer: Self-pay | Admitting: *Deleted

## 2013-11-22 NOTE — Telephone Encounter (Signed)
Had lab work done a while back.  I'd like to see what the results are.  Haven't had any follow-up.  I called and informed the patient that we only have the preliminary which only shows there's fungus.  It normally takes 6 weeks to get the final results, which will determine how to treat.  She asked if we would call once we got the final results.  I informed her yes, we will call.

## 2013-12-21 ENCOUNTER — Telehealth: Payer: Self-pay | Admitting: *Deleted

## 2013-12-21 NOTE — Telephone Encounter (Signed)
I called and informed her I went to the website and printed the results.  I told her she will need to schedule an appointment with Dr. Al CorpusHyatt to discuss treatment.  She stated okay.  I transferred her to a scheduler to make an appointment.

## 2013-12-21 NOTE — Telephone Encounter (Signed)
Message copied by Enedina FinnerMEADOWS, Taralee Marcus J on Wed Dec 21, 2013  8:44 AM ------      Message from: Rose[place]SMITH, [place]JESSICA M      Created: Wed Dec 21, 2013  8:05 AM      Regarding: Lab Resutls      Contact: [phone]       Pt saw Dr. Al CorpusHyatt back on November 01, 2013 and has not heard anything back from our office. ------

## 2013-12-23 ENCOUNTER — Encounter: Payer: Self-pay | Admitting: Podiatry

## 2013-12-27 ENCOUNTER — Ambulatory Visit (INDEPENDENT_AMBULATORY_CARE_PROVIDER_SITE_OTHER): Payer: BC Managed Care – PPO | Admitting: Podiatry

## 2013-12-27 ENCOUNTER — Encounter: Payer: Self-pay | Admitting: Podiatry

## 2013-12-27 VITALS — BP 128/65 | HR 72 | Resp 16

## 2013-12-27 DIAGNOSIS — Z79899 Other long term (current) drug therapy: Secondary | ICD-10-CM

## 2013-12-27 MED ORDER — TERBINAFINE HCL 250 MG PO TABS: 250.0000 mg | ORAL_TABLET | Freq: Every day | ORAL | Status: DC

## 2013-12-27 NOTE — Patient Instructions (Signed)

## 2013-12-27 NOTE — Progress Notes (Signed)
She presents today for repeat of her culture. He come back positive for fungus.  Objective: Onychomycosis bilateral foot.  Assessment: Onychomycosis.  Plan: We discussed her options today consisting of laser therapy oral therapy or topical therapy. We decided on oral therapy. We will prescription for Lamisil 250 mg tablets one by mouth daily. We went over all the possibilities as far as reactions and possible complications.. I also sent her out for liver profile and CBC. I will followup with her in 30 days.

## 2013-12-28 LAB — HEPATIC FUNCTION PANEL
ALK PHOS: 55 U/L (ref 39–117)
ALT: 37 U/L — AB (ref 0–35)
AST: 26 U/L (ref 0–37)
Albumin: 4.1 g/dL (ref 3.5–5.2)
TOTAL PROTEIN: 6.3 g/dL (ref 6.0–8.3)
Total Bilirubin: 0.2 mg/dL (ref 0.2–1.2)

## 2014-01-16 ENCOUNTER — Telehealth: Payer: Self-pay | Admitting: *Deleted

## 2014-01-16 NOTE — Telephone Encounter (Signed)
I left her a message that Dr. Al CorpusHyatt said there was a liver enzyme that was minimally elevated. There is nothing to worry about.  He will follow-up with you in one month for another liver profile.  Please call if you have any questions.

## 2014-01-16 NOTE — Telephone Encounter (Signed)
Message copied by Enedina FinnerMEADOWS, Mayumi Summerson J on Mon Jan 16, 2014 10:16 AM ------      Message from: Lottie RaterPREVETTE, ASHLEY E      Created: Thu Jan 12, 2014  8:28 AM                   ----- Message -----         From: Elinor ParkinsonMax T Hyatt, DPM         Sent: 01/08/2014   2:03 PM           To: Redmond SchoolAshley E Prevette, PMAC            ALT minimally elevated and will fu with her in one month for another liver profile. ------

## 2014-01-18 ENCOUNTER — Other Ambulatory Visit: Payer: Self-pay | Admitting: *Deleted

## 2014-01-18 ENCOUNTER — Encounter: Payer: Self-pay | Admitting: *Deleted

## 2014-01-20 ENCOUNTER — Ambulatory Visit (INDEPENDENT_AMBULATORY_CARE_PROVIDER_SITE_OTHER): Payer: BC Managed Care – HMO | Admitting: Cardiology

## 2014-01-20 ENCOUNTER — Encounter: Payer: Self-pay | Admitting: Cardiology

## 2014-01-20 VITALS — BP 160/120 | HR 78 | Ht 67.0 in | Wt 198.0 lb

## 2014-01-20 DIAGNOSIS — R0989 Other specified symptoms and signs involving the circulatory and respiratory systems: Secondary | ICD-10-CM

## 2014-01-20 DIAGNOSIS — R0600 Dyspnea, unspecified: Secondary | ICD-10-CM

## 2014-01-20 DIAGNOSIS — R0602 Shortness of breath: Secondary | ICD-10-CM

## 2014-01-20 DIAGNOSIS — R0609 Other forms of dyspnea: Secondary | ICD-10-CM

## 2014-01-20 LAB — BRAIN NATRIURETIC PEPTIDE: Brain Natriuretic Peptide: 28.5 pg/mL (ref 0.0–100.0)

## 2014-01-20 MED ORDER — AMLODIPINE BESYLATE 2.5 MG PO TABS: 2.5000 mg | ORAL_TABLET | Freq: Every day | ORAL | Status: DC

## 2014-01-20 NOTE — Patient Instructions (Signed)
Please start Amlodipine 2.5 mg a day. Continue all other medications as listed  Please have blood work today (BNP) Please have a 24 hour urine collection.  Follow up with PA/NP in 4 to 6 weeks.

## 2014-01-20 NOTE — Progress Notes (Signed)
HPI The patient presents for evaluation of dyspnea. She's had a complicated history for the past 8 months or so. She's had severe hives an extensive workup. She says some of this was found to be related to food allergies. She has been treated with antihistamines this has also improved her situation. She has had shortness of breath however with weight gain. She said she's not short of breath doing mild to moderate activities on level ground. She short of breath walking a flight of stairs. She is not describing PND or orthopnea. She's not having chest pressure, neck or arm discomfort.  She does feel a fullness in her neck at times. She feels when she gets short of breath but she's also anxious and lightheaded. She's been unable to work out with a Systems analystpersonal trainer because of all of this.  Allergies  Allergen Reactions  . Flexeril [Cyclobenzaprine] Hives    Current Outpatient Prescriptions  Medication Sig Dispense Refill  . Cetirizine HCl (ZYRTEC ALLERGY PO) Take by mouth.      . Cimetidine (TAGAMET PO) Take by mouth.      . cloNIDine (CATAPRES) 0.1 MG tablet Take 0.1 mg by mouth daily.      Marland Kitchen. levonorgestrel-ethinyl estradiol (LYBREL,AMETHYST) 90-20 MCG tablet Take 1 tablet by mouth daily.      Marland Kitchen. terbinafine (LAMISIL) 250 MG tablet Take 1 tablet (250 mg total) by mouth daily.  30 tablet  0   No current facility-administered medications for this visit.    Past Medical History  Diagnosis Date  . Anxiety   . Depression   . History of rectal bleeding 04/2006    With bowel movement  . Anemia   . Chicken pox   . Yeast infection   . Hypertension   . GERD (gastroesophageal reflux disease)     occ  . Allergy     Past Surgical History  Procedure Laterality Date  . Wisdom tooth extraction    . Cesarean section    . Anterior cervical decomp/discectomy fusion  06/21/2012    Procedure: ANTERIOR CERVICAL DECOMPRESSION/DISCECTOMY FUSION 2 LEVELS;  Surgeon: Eldred MangesMark C Yates, MD;  Location: [facility] OR;   Service: Orthopedics;  Laterality: N/A;  C5-6, C6-7 Anterior  Cervical Discectomy and Fusion, Allograft, Plate    Family History  Problem Relation Age of Onset  . Diabetes    . Heart disease    . Hypertension      History   Social History  . Marital Status: Married    Spouse Name: N/A    Number of Children: N/A  . Years of Education: N/A   Occupational History  . Not on file.   Social History Main Topics  . Smoking status: Never Smoker   . Smokeless tobacco: Not on file  . Alcohol Use: 0.6 oz/week    1 Glasses of wine per week  . Drug Use: No  . Sexual Activity: Yes    Birth Control/ Protection: Pill   Other Topics Concern  . Not on file   Social History Narrative  . No narrative on file    ROS:  As stated in the HPI and negative for all other systems.  PHYSICAL EXAM BP 160/120  Pulse 78  Ht 5\' 7"  (1.702 m)  Wt 198 lb (89.812 kg)  BMI 31.00 kg/m2 GENERAL:  Well appearing HEENT:  Pupils equal round and reactive, fundi not visualized, oral mucosa unremarkable NECK:  No jugular venous distention, waveform within normal limits, carotid upstroke brisk and symmetric,  no bruits, no thyromegaly LYMPHATICS:  No cervical, inguinal adenopathy LUNGS:  Clear to auscultation bilaterally BACK:  No CVA tenderness CHEST:  Unremarkable HEART:  PMI not displaced or sustained,S1 and S2 within normal limits, no S3, no S4, no clicks, no rubs, no murmurs ABD:  Flat, positive bowel sounds normal in frequency in pitch, no bruits, no rebound, no guarding, no midline pulsatile mass, no hepatomegaly, no splenomegaly EXT:  2 plus pulses throughout, no edema, no cyanosis no clubbing SKIN:  No rashes no nodules NEURO:  Cranial nerves II through XII grossly intact, motor grossly intact throughout PSYCH:  Cognitively intact, oriented to person place and time  EKG:  Sinus arrhythmia, rate 78, axis within normal limits, intervals within normal limits, no acute ST-T wave changes.   01/20/2014  ASSESSMENT AND PLAN  DYSPNEA:  The patient's symptoms are somewhat atypical. I will start with a BNP level. I will have a low threshold for further testing such as exercise treadmill testing or an echocardiogram.  HTN:  Her blood pressure is elevated and could be contributing to her symptoms. She's had an extensive workup to include chemistries and thyroid studies which apparently were unremarkable. I will try to verify these results. I will check 24-hour urine for VMA and metanephrines and have a low threshold for renal Dopplers. I will start Norvasc 2.5 mg daily.

## 2014-01-22 DIAGNOSIS — R0600 Dyspnea, unspecified: Secondary | ICD-10-CM | POA: Insufficient documentation

## 2014-01-23 ENCOUNTER — Telehealth: Payer: Self-pay | Admitting: *Deleted

## 2014-01-23 ENCOUNTER — Other Ambulatory Visit: Payer: BC Managed Care – HMO

## 2014-01-23 NOTE — Telephone Encounter (Signed)
I just received a call from you last week.  Do I still need to come in for another appointment with Dr. Al CorpusHyatt on tomorrow?  I read his note and advised her he does want to see her.  Will probably give her another requisition for lab and another prescription.  She stated that's all she needed to know.

## 2014-01-24 ENCOUNTER — Ambulatory Visit (INDEPENDENT_AMBULATORY_CARE_PROVIDER_SITE_OTHER): Payer: BC Managed Care – PPO | Admitting: Podiatry

## 2014-01-24 ENCOUNTER — Encounter: Payer: Self-pay | Admitting: Podiatry

## 2014-01-24 VITALS — BP 154/104 | HR 91 | Resp 16

## 2014-01-24 DIAGNOSIS — Z79899 Other long term (current) drug therapy: Secondary | ICD-10-CM

## 2014-01-24 MED ORDER — TERBINAFINE HCL 250 MG PO TABS: 250.0000 mg | ORAL_TABLET | Freq: Every day | ORAL | Status: AC

## 2014-01-24 NOTE — Progress Notes (Signed)
She presents today for followup of her long-term treatment of onychomycosis with terbinafine. Her last liver enzymes are slightly elevated.  Objective: Vital signs are stable she is alert and oriented x3. She denies any problems with medication. She still maintains onychomycosis.  Assessment: Onychomycosis bilateral.  Plan: Continue the use of Lamisil for another 90 days we sent her out for another liver profile. Should back abnormal or more elevated than previously we will notify her immediately.

## 2014-01-25 LAB — HEPATIC FUNCTION PANEL
ALBUMIN: 4.5 g/dL (ref 3.5–5.2)
ALK PHOS: 68 U/L (ref 39–117)
ALT: 45 U/L — ABNORMAL HIGH (ref 0–35)
AST: 24 U/L (ref 0–37)
BILIRUBIN INDIRECT: 0.2 mg/dL (ref 0.2–1.2)
BILIRUBIN TOTAL: 0.3 mg/dL (ref 0.2–1.2)
Bilirubin, Direct: 0.1 mg/dL (ref 0.0–0.3)
TOTAL PROTEIN: 6.8 g/dL (ref 6.0–8.3)

## 2014-01-26 LAB — METANEPHRINES, URINE, 24 HOUR
Metaneph Total, Ur: 219 mcg/24 h (ref 182–739)
Metanephrines, Ur: 46 mcg/24 h — ABNORMAL LOW (ref 58–203)
Normetanephrine, 24H Ur: 173 mcg/24 h (ref 88–649)

## 2014-01-27 LAB — VMA, URINE, 24 HOUR
CREATININE 24H URINE: 0.89 g/(24.h) (ref 0.63–2.50)
VANILLYLMANDELIC ACID, (VMA): 1.4 mg/(24.h) (ref <=6.0)

## 2014-01-31 ENCOUNTER — Telehealth: Payer: Self-pay | Admitting: *Deleted

## 2014-01-31 DIAGNOSIS — I10 Essential (primary) hypertension: Secondary | ICD-10-CM

## 2014-01-31 NOTE — Telephone Encounter (Signed)
Called and left message for pt that per Dr Antoine PocheHochrein her 24 hour urine testing is normal but that she needs to be scheduled for renal doppler due to difficult to control HTN.  Requested she call back.   Negative VMA. Order renal Dopplers for difficult to control HTN.

## 2014-01-31 NOTE — Telephone Encounter (Signed)
Pt aware of results and need for renal u/s  She understands someone will call her back to schedule

## 2014-01-31 NOTE — Telephone Encounter (Signed)
F/u ° ° °Pt returning your call °

## 2014-02-06 ENCOUNTER — Telehealth: Payer: Self-pay | Admitting: *Deleted

## 2014-02-06 NOTE — Telephone Encounter (Signed)
I called and left the patient a message per Dr. Al CorpusHyatt that her liver enzymes were slightly elevated on her lab results.  He wants you to take the Lamisil once every other day.  He wants you to do another liver profile in a month.  Please call with any questions or concerns.

## 2014-02-06 NOTE — Telephone Encounter (Signed)
Message copied by Enedina FinnerMEADOWS, Axten Pascucci J on Mon Feb 06, 2014 10:21 AM ------      Message from: Lottie RaterPREVETTE, ASHLEY E      Created: Mon Feb 06, 2014  8:30 AM                   ----- Message -----         From: Elinor ParkinsonMax T Hyatt, DPM         Sent: 01/25/2014   7:45 AM           To: Redmond SchoolAshley E Prevette, [facility]            Liver profile slightly more elevated than previously noted.  I suggest that the patient continue the medication but start every other day.  She should follow up with us in one month for another liver profile. ------

## 2014-02-08 ENCOUNTER — Ambulatory Visit (HOSPITAL_COMMUNITY): Payer: BC Managed Care – HMO | Attending: Cardiovascular Disease | Admitting: Cardiology

## 2014-02-08 DIAGNOSIS — I10 Essential (primary) hypertension: Secondary | ICD-10-CM | POA: Diagnosis not present

## 2014-02-08 NOTE — Progress Notes (Signed)
Renal artery duplex performed  

## 2014-02-14 ENCOUNTER — Telehealth: Payer: Self-pay | Admitting: Cardiology

## 2014-02-14 NOTE — Telephone Encounter (Signed)
New message          C/o feet and ankles are swollen , is this normal

## 2014-02-15 NOTE — Telephone Encounter (Signed)
Pt. States the swelling has gone down now and is doing better, pt has appt on the 30th with Dr. Antoine PocheHochrein , but i told her to call if it got worse

## 2014-02-20 ENCOUNTER — Telehealth: Payer: Self-pay | Admitting: Cardiology

## 2014-02-20 NOTE — Telephone Encounter (Signed)
Spoke with pt, aware renal has not been resulted yet. [facility] [address] to [facility] for us.

## 2014-02-20 NOTE — Telephone Encounter (Signed)
Do we have her ultrasound results yet?

## 2014-02-21 NOTE — Telephone Encounter (Signed)
Sp[facility] with pt, [facility] into [facility] system. They reported to me they were normal but pt is aware I can not see the report yet.

## 2014-02-21 NOTE — Telephone Encounter (Signed)
Pt called again,still waiting for her results. °

## 2014-03-02 ENCOUNTER — Ambulatory Visit: Payer: BC Managed Care – HMO | Admitting: Physician Assistant

## 2014-03-02 ENCOUNTER — Ambulatory Visit: Payer: BC Managed Care – HMO | Admitting: Cardiology

## 2014-03-30 ENCOUNTER — Emergency Department (HOSPITAL_COMMUNITY)
Admission: EM | Admit: 2014-03-30 | Discharge: 2014-03-31 | Disposition: A | Discharge status: Home / Self Care | Payer: BC Managed Care – HMO | Attending: Emergency Medicine | Admitting: Emergency Medicine

## 2014-03-30 ENCOUNTER — Encounter (HOSPITAL_COMMUNITY): Payer: Self-pay | Admitting: Emergency Medicine

## 2014-03-30 DIAGNOSIS — Z8619 Personal history of other infectious and parasitic diseases: Secondary | ICD-10-CM | POA: Insufficient documentation

## 2014-03-30 DIAGNOSIS — J029 Acute pharyngitis, unspecified: Secondary | ICD-10-CM | POA: Insufficient documentation

## 2014-03-30 DIAGNOSIS — R5381 Other malaise: Secondary | ICD-10-CM | POA: Diagnosis not present

## 2014-03-30 DIAGNOSIS — R21 Rash and other nonspecific skin eruption: Secondary | ICD-10-CM | POA: Diagnosis present

## 2014-03-30 DIAGNOSIS — T783XXA Angioneurotic edema, initial encounter: Secondary | ICD-10-CM | POA: Insufficient documentation

## 2014-03-30 DIAGNOSIS — Z8719 Personal history of other diseases of the digestive system: Secondary | ICD-10-CM | POA: Diagnosis not present

## 2014-03-30 DIAGNOSIS — Z862 Personal history of diseases of the blood and blood-forming organs and certain disorders involving the immune mechanism: Secondary | ICD-10-CM | POA: Insufficient documentation

## 2014-03-30 DIAGNOSIS — R220 Localized swelling, mass and lump, head: Secondary | ICD-10-CM | POA: Diagnosis not present

## 2014-03-30 DIAGNOSIS — Z79899 Other long term (current) drug therapy: Secondary | ICD-10-CM | POA: Diagnosis not present

## 2014-03-30 DIAGNOSIS — R5383 Other fatigue: Secondary | ICD-10-CM

## 2014-03-30 DIAGNOSIS — I10 Essential (primary) hypertension: Secondary | ICD-10-CM | POA: Insufficient documentation

## 2014-03-30 DIAGNOSIS — R221 Localized swelling, mass and lump, neck: Secondary | ICD-10-CM

## 2014-03-30 DIAGNOSIS — Z8659 Personal history of other mental and behavioral disorders: Secondary | ICD-10-CM | POA: Diagnosis not present

## 2014-03-30 DIAGNOSIS — T4995XA Adverse effect of unspecified topical agent, initial encounter: Secondary | ICD-10-CM | POA: Diagnosis not present

## 2014-03-30 MED ORDER — METHYLPREDNISOLONE SODIUM SUCC 125 MG IJ SOLR
125.0000 mg | Freq: Once | INTRAMUSCULAR | Status: AC
  Administered 2014-03-30: 125 mg via INTRAVENOUS
  Filled 2014-03-30: qty 2

## 2014-03-30 MED ORDER — DIPHENHYDRAMINE HCL 50 MG/ML IJ SOLN
25.0000 mg | Freq: Once | INTRAMUSCULAR | Status: AC
  Administered 2014-03-30: 25 mg via INTRAVENOUS
  Filled 2014-03-30: qty 1

## 2014-03-30 MED ORDER — FAMOTIDINE IN NACL 20-0.9 MG/50ML-% IV SOLN
20.0000 mg | Freq: Once | INTRAVENOUS | Status: AC
  Administered 2014-03-30: 20 mg via INTRAVENOUS
  Filled 2014-03-30: qty 50

## 2014-03-30 NOTE — ED Provider Notes (Addendum)
CSN: [id number]     Arrival date & time 03/30/14  2217 History   First MD Initiated Contact with Patient 03/30/14 2227     Chief Complaint  Patient presents with  . Allergic Reaction     (Consider location/radiation/quality/duration/timing/severity/associated sxs/prior Treatment) Patient is a 43 y.o. female presenting with allergic reaction. The history is provided by the patient.  Allergic Reaction Presenting symptoms: rash   Presenting symptoms: no difficulty swallowing and no wheezing    patient has a history of unknown allergic reaction versus chronic hives. She's seen many specialists without a clear cause. She's been on many different treatments. 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 She states that today she had been feeling worse. She states her lipids and swollen while at work today. She states her regimen a little worse. She states she ate dinner and then again in more swelling. She states she feels tight in her throat and swelling to face. She states it has not been severe in the past. No chest pain. There is some tightness of the chest. No fevers. No difficulty with swallowing.  Past Medical History  Diagnosis Date  . Anxiety   . Depression   . History of rectal bleeding 04/2006    With bowel movement  . Anemia   . Chicken pox   . Yeast infection   . Hypertension   . GERD (gastroesophageal reflux disease)     occ  . Allergy    Past Surgical History  Procedure Laterality Date  . Wisdom tooth extraction    . Cesarean section    . Anterior cervical decomp/discectomy fusion  06/21/2012    Procedure: ANTERIOR CERVICAL DECOMPRESSION/DISCECTOMY FUSION 2 LEVELS;  Surgeon: Eldred Manges, MD;  Location: [facility] OR;  Service: Orthopedics;  Laterality: N/A;  C5-6, C6-7 Anterior  Cervical Discectomy and Fusion, Allograft, Plate   Family History  Problem Relation Age of Onset  . Stroke Mother   . Hypertension Mother   . CAD Brother 40    No details   History  Substance Use  Topics  . Smoking status: Never Smoker   . Smokeless tobacco: Not on file  . Alcohol Use: 0.6 oz/week    1 Glasses of wine per week   OB History   Grav Para Term Preterm Abortions TAB SAB Ect Mult Living                 Review of Systems  Constitutional: Positive for fatigue. Negative for activity change and appetite change.  HENT: Positive for facial swelling and sore throat. Negative for hearing loss, sinus pressure and trouble swallowing.   Eyes: Negative for pain.  Respiratory: Negative for chest tightness, shortness of breath, wheezing and stridor.   Cardiovascular: Negative for chest pain and leg swelling.  Gastrointestinal: Negative for nausea, vomiting, abdominal pain and diarrhea.  Genitourinary: Negative for flank pain.  Musculoskeletal: Negative for back pain and neck stiffness.  Skin: Positive for rash.  Neurological: Negative for weakness, numbness and headaches.  Psychiatric/Behavioral: Negative for behavioral problems.      Allergies  Flexeril  Home Medications   Prior to Admission medications   Medication Sig Start Date End Date Taking? Authorizing Provider  amLODipine (NORVASC) 2.5 MG tablet Take 1 tablet (2.5 mg total) by mouth daily. 01/20/14  Yes Rollene Rotunda, MD  cetirizine (ZYRTEC) 10 MG tablet Take 20 mg by mouth as needed for allergies.   Yes Historical Provider, MD  cloNIDine (CATAPRES) 0.1 MG tablet Take 0.1 mg  by mouth daily.   Yes Historical Provider, MD  levonorgestrel-ethinyl estradiol (LYBREL,AMETHYST) 90-20 MCG tablet Take 1 tablet by mouth daily.   Yes Historical Provider, MD  terbinafine (LAMISIL) 250 MG tablet Take 1 tablet (250 mg total) by mouth daily. 01/24/14  Yes Max T Hyatt, DPM   BP 159/106  Pulse 108  Temp(Src) 97.8 F (36.6 C) (Oral)  Resp 24  SpO2 98% Physical Exam  Nursing note and vitals reviewed. Constitutional: She is oriented to person, place, and time. She appears well-developed and well-nourished.  HENT:  Head:  Normocephalic and atraumatic.  Mouth/Throat: No oropharyngeal exudate.  Angioedema to face. Swelling worse of upper lip compared to lower lip. Patient is right eye is swollen shut. Some swelling in the left eye also. Diffuse facial swelling also.  Eyes: EOM are normal. Pupils are equal, round, and reactive to light.  Neck: Normal range of motion. Neck supple.  Cardiovascular: Normal rate, regular rhythm and normal heart sounds.   No murmur heard. Pulmonary/Chest: Effort normal and breath sounds normal. No respiratory distress. She has no wheezes. She has no rales.  Abdominal: Soft. Bowel sounds are normal. She exhibits no distension. There is no tenderness. There is no rebound and no guarding.  Musculoskeletal: Normal range of motion.  Neurological: She is alert and oriented to person, place, and time. No cranial nerve deficit.  Skin: Skin is warm and dry. Rash noted.  Diffuse hives on face. Some extremity has also.  Psychiatric: She has a normal mood and affect. Her speech is normal.    ED Course  Procedures (including critical care time) Labs Review Labs Reviewed - No data to display  Imaging Review No results found.   EKG Interpretation None      MDM   Final diagnoses:  None    Patient with allergic reaction versus angioedema. Has had extensive workup in the past. Began around 5:00 to get worse. She states this is the worst it has been. She does have some feeling of tightness in her throat, however as far as I can see the posterior pharynx is normal. We'll be monitoring for a few more hours. If worsening may need admission. Will give steroids Benadryl and Pepcid.    Juliet Rude. Rubin Payor, MD 03/30/14 2340  On reexam there has been some improvement of the swelling. States her throat still still sore. She has less swelling of her eyes is a crackle in the right eye somewhat now.  Juliet Rude. Rubin Payor, MD 03/31/14 [id number]

## 2014-03-30 NOTE — ED Notes (Signed)
Initial Contact - pt A+Ox4, family at bedside, pt reports allergic reaction to unknown substance, reports having numerous reactions in the past year, has been worked up by PCP, sts "allergic to everything".  Small, red hives noted to entire body.  Pt also with significant facial/lip swelling noted.  Pt reports started earlier today, but has worsened through the day "and this is the worst it's ever been".  Pt denies SOB.  No difficulty clearing secretions or maintaining airway at this time.  Speaking full/clear sentences, rr even/un-lab.  MAEI.  NAD.

## 2014-03-30 NOTE — ED Notes (Signed)
Pt presents with generalized facial edema, urticaria and mild shortness of breath family and pt remark on significant allergic reaction to "alot of things" last PO intake included: Aspergus, tomato, zukini with coconut oil. Taken OTC zantac total 2 tablets without getting relief to symptoms. MD and nurses offering emergent care at this time.

## 2014-03-31 MED ORDER — PREDNISONE 20 MG PO TABS: 40.0000 mg | ORAL_TABLET | Freq: Every day | ORAL | Status: AC

## 2014-03-31 MED ORDER — EPINEPHRINE 0.3 MG/0.3ML IJ SOAJ: 0.3000 mg | INTRAMUSCULAR | Status: DC | PRN

## 2014-03-31 NOTE — ED Provider Notes (Signed)
Recheck at 2:00 AM, improved swelling, no difficulty with speaking swallowing or breathing, no wheezing on exam, family feels comfortable going home with improving symptoms.  Vida Roller, MD 03/31/14 [phone]

## 2014-03-31 NOTE — Discharge Instructions (Signed)
Angioedema °Angioedema is a sudden swelling of tissues, often of the skin. It can occur on the face or genitals or in the abdomen or other body parts. The swelling usually develops over a short period and gets better in 24 to 48 hours. It often begins during the night and is found when the person wakes up. The person may also get red, itchy patches of skin (hives). Angioedema can be dangerous if it involves swelling of the air passages.  °Depending on the cause, episodes of angioedema may only happen once, come back in unpredictable patterns, or repeat for several years and then gradually fade away.  °CAUSES  °Angioedema can be caused by an allergic reaction to various triggers. It can also result from nonallergic causes, including reactions to drugs, immune system disorders, viral infections, or an abnormal gene that is passed to you from your parents (hereditary). For some people with angioedema, the cause is unknown.  °Some things that can trigger angioedema include:  °· Foods.   °· Medicines, such as ACE inhibitors, ARBs, nonsteroidal anti-inflammatory agents, or estrogen.   °· Latex.   °· Animal saliva.   °· Insect stings.   °· Dyes used in X-rays.   °· Mild injury.   °· Dental work. °· Surgery. °· Stress.   °· Sudden changes in temperature.   °· Exercise. °SIGNS AND SYMPTOMS  °· Swelling of the skin. °· Hives. If these are present, there is also intense itching. °· Redness in the affected area.   °· Pain in the affected area. °· Swollen lips or tongue. °· Breathing problems. This may happen if the air passages swell. °· Wheezing. °If internal organs are involved, there may be:  °· Nausea.   °· Abdominal pain.   °· Vomiting.   °· Difficulty swallowing.   °· Difficulty passing urine. °DIAGNOSIS  °· Your health care provider will examine the affected area and take a medical and family history. °· Various tests may be done to help determine the cause. Tests may include: °¨ Allergy skin tests to see if the problem  is an allergic reaction.   °¨ Blood tests to check for hereditary angioedema.   °¨ Tests to check for underlying diseases that could cause the condition.   °· A review of your medicines, including over-the-counter medicines, may be done. °TREATMENT  °Treatment will depend on the cause of the angioedema. Possible treatments include:  °· Removal of anything that triggered the condition (such as stopping certain medicines).   °· Medicines to treat symptoms or prevent attacks. Medicines given may include:   °¨ Antihistamines.   °¨ Epinephrine injection.   °¨ Steroids.   °· Hospitalization may be required for severe attacks. If the air passages are affected, it can be an emergency. Tubes may need to be placed to keep the airway open. °HOME CARE INSTRUCTIONS  °· Take all medicines as directed by your health care provider. °· If you were given medicines for emergency allergy treatment, always carry them with you. °· Wear a medical bracelet as directed by your health care provider.   °· Avoid known triggers. °SEEK MEDICAL CARE IF:  °· You have repeat attacks of angioedema.   °· Your attacks are more frequent or more severe despite preventive measures.   °· You have hereditary angioedema and are considering having children. It is important to discuss with your health care provider the risks of passing the condition on to your children. °SEEK IMMEDIATE MEDICAL CARE IF:  °· You have severe swelling of the mouth, tongue, or lips. °· You have difficulty breathing.   °· You have difficulty swallowing.   °· You faint. °MAKE   SURE YOU: °· Understand these instructions. °· Will watch your condition. °· Will get help right away if you are not doing well or get worse. °Document Released: 09/29/2001 Document Revised: 12/05/2013 Document Reviewed: 03/14/2013 °[employer]® Patient Information ©2015 [employer]. This information is not intended to replace advice given to you by your health care provider. Make sure you discuss any questions  you have with your health care provider. ° °

## 2014-04-21 ENCOUNTER — Encounter: Payer: Self-pay | Admitting: Cardiology

## 2014-04-21 ENCOUNTER — Ambulatory Visit (INDEPENDENT_AMBULATORY_CARE_PROVIDER_SITE_OTHER): Payer: BC Managed Care – HMO | Admitting: Cardiology

## 2014-04-21 VITALS — BP 121/84 | HR 81 | Ht 67.0 in | Wt 199.7 lb

## 2014-04-21 DIAGNOSIS — R0989 Other specified symptoms and signs involving the circulatory and respiratory systems: Secondary | ICD-10-CM

## 2014-04-21 DIAGNOSIS — R0609 Other forms of dyspnea: Secondary | ICD-10-CM

## 2014-04-21 DIAGNOSIS — R0600 Dyspnea, unspecified: Secondary | ICD-10-CM

## 2014-04-21 NOTE — Progress Notes (Signed)
HPI The patient presents for evaluation of dyspnea and HTN that is difficult to control.  She has had an extensive workup for high ongoing. She also has angioedema and has had anemia her visit for this. She's been managed with a short course of steroids. The etiology of this is not clear. I have evaluated her for dyspnea she had a normal BNP. Her breathing is actually better. She has had difficult to control hypertension but she had negative renal ultrasound and VMA.  She was started on Norvasc and her BP is better.    Allergies  Allergen Reactions  . Flexeril [Cyclobenzaprine] Hives    Current Outpatient Prescriptions  Medication Sig Dispense Refill  . amLODipine (NORVASC) 2.5 MG tablet Take 1 tablet (2.5 mg total) by mouth daily.  180 tablet  3  . cetirizine (ZYRTEC) 10 MG tablet Take 20 mg by mouth as needed for allergies.      . cimetidine (TAGAMET) 800 MG tablet Take 1 tablet by mouth daily.      . cloNIDine (CATAPRES) 0.1 MG tablet Take 0.1 mg by mouth daily.      Marland Kitchen EPINEPHrine (EPIPEN) 0.3 mg/0.3 mL IJ SOAJ injection Inject 0.3 mLs (0.3 mg total) into the muscle as needed.  2 Device  0  . FLUoxetine (PROZAC) 20 MG tablet Take 1 tablet by mouth daily.      . hydrochlorothiazide (MICROZIDE) 12.5 MG capsule Take 1 capsule by mouth daily.      Marland Kitchen levonorgestrel-ethinyl estradiol (LYBREL,AMETHYST) 90-20 MCG tablet Take 1 tablet by mouth daily.      . predniSONE (DELTASONE) 20 MG tablet Take 2 tablets (40 mg total) by mouth daily.  4 tablet  0  . terbinafine (LAMISIL) 250 MG tablet Take 1 tablet (250 mg total) by mouth daily.  90 tablet  0   No current facility-administered medications for this visit.    Past Medical History  Diagnosis Date  . Anxiety   . Depression   . History of rectal bleeding 04/2006    With bowel movement  . Anemia   . Chicken pox   . Yeast infection   . Hypertension   . GERD (gastroesophageal reflux disease)     occ  . Allergy     Past Surgical History   Procedure Laterality Date  . Wisdom tooth extraction    . Cesarean section    . Anterior cervical decomp/discectomy fusion  06/21/2012    Procedure: ANTERIOR CERVICAL DECOMPRESSION/DISCECTOMY FUSION 2 LEVELS;  Surgeon: Eldred Manges, MD;  Location: [facility] OR;  Service: Orthopedics;  Laterality: N/A;  C5-6, C6-7 Anterior  Cervical Discectomy and Fusion, Allograft, Plate    ROS:  As stated in the HPI and negative for all other systems.  PHYSICAL EXAM BP 121/84  Pulse 81  Ht  (1.702 m)  Wt 199 lb 11.2 oz (90.583 kg)  BMI 31.27 kg/m2 GENERAL:  Well appearing NECK:  No jugular venous distention, waveform within normal limits, carotid upstroke brisk and symmetric, no bruits, no thyromegaly LUNGS:  Clear to auscultation bilaterally CHEST:  Unremarkable HEART:  PMI not displaced or sustained,S1 and S2 within normal limits, no S3, no S4, no clicks, no rubs, no murmurs ABD:  Flat, positive bowel sounds normal in frequency in pitch, no bruits, no rebound, no guarding, no midline pulsatile mass, no hepatomegaly, no splenomegaly EXT:  2 plus pulses throughout, no edema, no cyanosis no clubbing  EKG:  Sinus arrhythmia, rate 81, axis within normal limits, intervals  within normal limits, no acute ST-T wave changes.  04/21/2014  ASSESSMENT AND PLAN  DYSPNEA:  This seems to be slightly improved. BNP was normal. At this point no further workup specifically for this is indicated.  HTN:  Her blood pressure is better on the Norvasc and she seems to tolerate this. She has symptoms of snoring daytime somnolence as well as headaches that are consistent with sleep apnea. I will order a sleep apnea study. Certainly management of this could make her blood pressure management easier.

## 2014-04-21 NOTE — Patient Instructions (Signed)
Your physician recommends that you schedule a follow-up appointment in:  6 months with Dr. Antoine Poche  We are ordering a sleep study

## 2014-05-30 ENCOUNTER — Ambulatory Visit: Payer: BC Managed Care – PPO | Admitting: Podiatry

## 2014-06-08 ENCOUNTER — Ambulatory Visit: Payer: BC Managed Care – PPO | Admitting: Podiatry

## 2014-06-27 ENCOUNTER — Ambulatory Visit (HOSPITAL_BASED_OUTPATIENT_CLINIC_OR_DEPARTMENT_OTHER): Payer: BC Managed Care – HMO | Attending: Cardiology

## 2015-03-23 ENCOUNTER — Other Ambulatory Visit: Payer: Self-pay | Admitting: Family

## 2015-03-23 DIAGNOSIS — E041 Nontoxic single thyroid nodule: Secondary | ICD-10-CM

## 2015-04-05 ENCOUNTER — Other Ambulatory Visit: Payer: Self-pay

## 2015-04-05 DIAGNOSIS — R0602 Shortness of breath: Secondary | ICD-10-CM

## 2015-04-06 MED ORDER — AMLODIPINE BESYLATE 2.5 MG PO TABS: 2.5000 mg | ORAL_TABLET | Freq: Every day | ORAL | Status: DC

## 2015-05-07 ENCOUNTER — Other Ambulatory Visit: Payer: Self-pay | Admitting: Cardiology

## 2015-05-07 NOTE — Telephone Encounter (Signed)
REFILL 

## 2015-06-05 ENCOUNTER — Other Ambulatory Visit: Payer: Self-pay | Admitting: Cardiology

## 2016-01-21 ENCOUNTER — Other Ambulatory Visit: Payer: Self-pay

## 2016-01-28 ENCOUNTER — Ambulatory Visit
Admission: RE | Admit: 2016-01-28 | Discharge: 2016-01-28 | Disposition: A | Discharge status: Home / Self Care | Payer: BLUE CROSS/BLUE SHIELD | Source: Ambulatory Visit | Attending: Family | Admitting: Family

## 2016-01-28 DIAGNOSIS — E041 Nontoxic single thyroid nodule: Secondary | ICD-10-CM

## 2016-03-23 ENCOUNTER — Other Ambulatory Visit: Payer: Self-pay | Admitting: Cardiology

## 2020-03-12 ENCOUNTER — Other Ambulatory Visit: Payer: Self-pay

## 2020-03-12 ENCOUNTER — Encounter (HOSPITAL_COMMUNITY): Payer: Self-pay

## 2020-03-12 ENCOUNTER — Emergency Department (HOSPITAL_COMMUNITY): Payer: 59

## 2020-03-12 ENCOUNTER — Emergency Department (HOSPITAL_COMMUNITY)
Admission: EM | Admit: 2020-03-12 | Discharge: 2020-03-12 | Disposition: A | Discharge status: Home / Self Care | Payer: 59 | Attending: Emergency Medicine | Admitting: Emergency Medicine

## 2020-03-12 DIAGNOSIS — R42 Dizziness and giddiness: Secondary | ICD-10-CM | POA: Insufficient documentation

## 2020-03-12 DIAGNOSIS — R000 Tachycardia, unspecified: Secondary | ICD-10-CM | POA: Diagnosis not present

## 2020-03-12 DIAGNOSIS — Z20822 Contact with and (suspected) exposure to covid-19: Secondary | ICD-10-CM

## 2020-03-12 DIAGNOSIS — R519 Headache, unspecified: Secondary | ICD-10-CM | POA: Diagnosis not present

## 2020-03-12 DIAGNOSIS — M542 Cervicalgia: Secondary | ICD-10-CM | POA: Diagnosis not present

## 2020-03-12 DIAGNOSIS — J1282 Pneumonia due to coronavirus disease 2019: Secondary | ICD-10-CM | POA: Diagnosis not present

## 2020-03-12 DIAGNOSIS — I10 Essential (primary) hypertension: Secondary | ICD-10-CM | POA: Diagnosis not present

## 2020-03-12 DIAGNOSIS — K219 Gastro-esophageal reflux disease without esophagitis: Secondary | ICD-10-CM | POA: Diagnosis not present

## 2020-03-12 DIAGNOSIS — R0602 Shortness of breath: Secondary | ICD-10-CM | POA: Diagnosis not present

## 2020-03-12 DIAGNOSIS — R61 Generalized hyperhidrosis: Secondary | ICD-10-CM | POA: Insufficient documentation

## 2020-03-12 DIAGNOSIS — Z79899 Other long term (current) drug therapy: Secondary | ICD-10-CM | POA: Diagnosis not present

## 2020-03-12 DIAGNOSIS — M549 Dorsalgia, unspecified: Secondary | ICD-10-CM | POA: Diagnosis not present

## 2020-03-12 DIAGNOSIS — R0789 Other chest pain: Secondary | ICD-10-CM | POA: Insufficient documentation

## 2020-03-12 LAB — TROPONIN I (HIGH SENSITIVITY)
Troponin I (High Sensitivity): <2 ng/L (ref <18)
Troponin I (High Sensitivity): 3 ng/L (ref <18)

## 2020-03-12 LAB — BASIC METABOLIC PANEL
Anion gap: 11 (ref 5–15)
BUN: 16 mg/dL (ref 6–20)
CO2: 20 mmol/L — ABNORMAL LOW (ref 22–32)
Calcium: 9.4 mg/dL (ref 8.9–10.3)
Chloride: 107 mmol/L (ref 98–111)
Creatinine, Ser: 0.75 mg/dL (ref 0.44–1.00)
GFR calc Af Amer: >60 mL/min (ref >60)
GFR calc non Af Amer: >60 mL/min (ref >60)
Glucose, Bld: 99 mg/dL (ref 70–99)
Potassium: 3.9 mmol/L (ref 3.5–5.1)
Sodium: 138 mmol/L (ref 135–145)

## 2020-03-12 LAB — I-STAT BETA HCG BLOOD, ED (NOT ORDERABLE): I-stat hCG, quantitative: <5 m[IU]/mL (ref <5)

## 2020-03-12 LAB — CBC
HCT: 46.7 % — ABNORMAL HIGH (ref 36.0–46.0)
Hemoglobin: 15.5 g/dL — ABNORMAL HIGH (ref 12.0–15.0)
MCH: 31.2 pg (ref 26.0–34.0)
MCHC: 33.2 g/dL (ref 30.0–36.0)
MCV: 94 fL (ref 80.0–100.0)
Platelets: 355 10*3/uL (ref 150–400)
RBC: 4.97 MIL/uL (ref 3.87–5.11)
RDW: 12.1 % (ref 11.5–15.5)
WBC: 9.9 10*3/uL (ref 4.0–10.5)
nRBC: 0 % (ref 0.0–0.2)

## 2020-03-12 MED ORDER — IOHEXOL 300 MG/ML  SOLN: 100.0000 mL | Freq: Once | INTRAMUSCULAR | Status: DC | PRN

## 2020-03-12 MED ORDER — SODIUM CHLORIDE (PF) 0.9 % IJ SOLN
INTRAMUSCULAR | Status: AC
  Filled 2020-03-12: qty 50

## 2020-03-12 MED ORDER — IOHEXOL 350 MG/ML SOLN
100.0000 mL | Freq: Once | INTRAVENOUS | Status: AC | PRN
  Administered 2020-03-12: 100 mL via INTRAVENOUS

## 2020-03-12 MED ORDER — SODIUM CHLORIDE 0.9% FLUSH: 3.0000 mL | Freq: Once | INTRAVENOUS | Status: DC
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
## 2020-03-12 NOTE — Discharge Instructions (Addendum)
Your CT today showed UPPER LOBE GROUND-GLASS OPACITIES consistent with Covid 19 infection.   We discussed obtaining a Covid 19 test however you refused this intervention.   I attempted to setup an infusion clinic appointment as your symptoms will likely worsen but you refused this visit.

## 2020-03-12 NOTE — ED Notes (Signed)
Pt seen ambulating to restroom without difficulty

## 2020-03-12 NOTE — ED Notes (Signed)
Patient came to the desk asking RN to remove her IV so she could go home. IV was removed and the patient was given her discharge papers. Patient did not stay to complete to complete the discharge process.

## 2020-03-12 NOTE — ED Notes (Signed)
Pt refused covid swab, pt made aware that provider would speak with her about results of CT scan and reasoning behind the swab. Pt is adamant about refusal of COVID swab. Provider made aware

## 2020-03-12 NOTE — ED Notes (Signed)
Pt transported to CT ?

## 2020-03-12 NOTE — ED Triage Notes (Signed)
Patient c/o entire chest pain that radiates into her teeth and jaws that started 2-3 hours ago. patient states she went to [facility] , but was sent here. Patient states she was diaphoretic and slight SOB earlier.

## 2020-03-12 NOTE — ED Notes (Signed)
Provider spoke with pt, pt continues to refuse Covid swabs and any other interventions at this time. Pt made aware of possibility of symptoms worsening, pt acknowledges that she understands

## 2021-07-15 IMAGING — CT CT ANGIO CHEST
3 of 7 series · 19 of 46 positions shown · IV contrast (APPLIED)
Comparison: Chest radiograph 03/12/2020

CLINICAL DATA: Chest pain, pleurisy suspected. Concern for
pulmonary embolism.

EXAM:
CT ANGIOGRAPHY CHEST WITH CONTRAST
TECHNIQUE: Multidetector CT imaging of the chest was performed using the
standard protocol during bolus administration of intravenous
contrast. Multiplanar CT image reconstructions and MIPs were
obtained to evaluate the vascular anatomy.
CONTRAST:  100mL OMNIPAQUE IOHEXOL 350 MG/ML SOLN

[Series 6: thins · axial · 0.79mm/px · z∈[-113,+171]mm · 15 of 326 slices shown]
[im 21/326  lung]
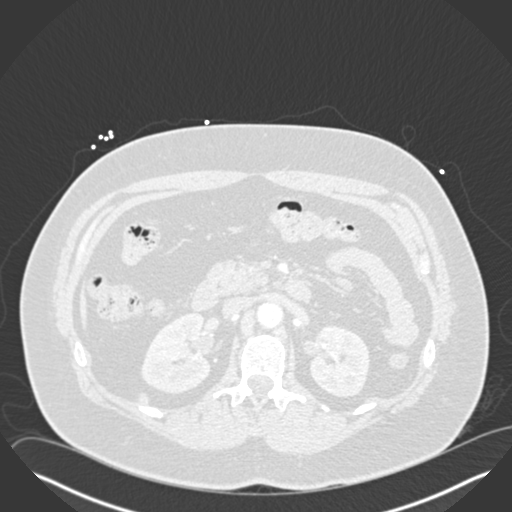
[im 41/326  soft-tissue]
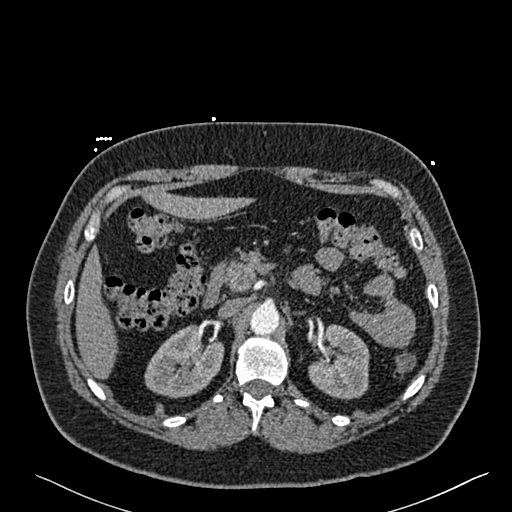
[im 61/326  lung]
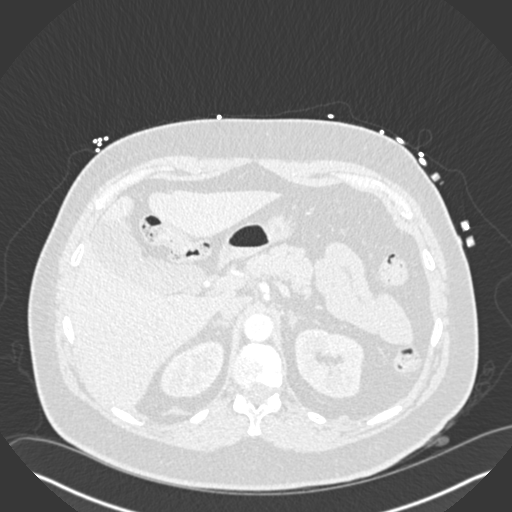
[im 82/326  soft-tissue]
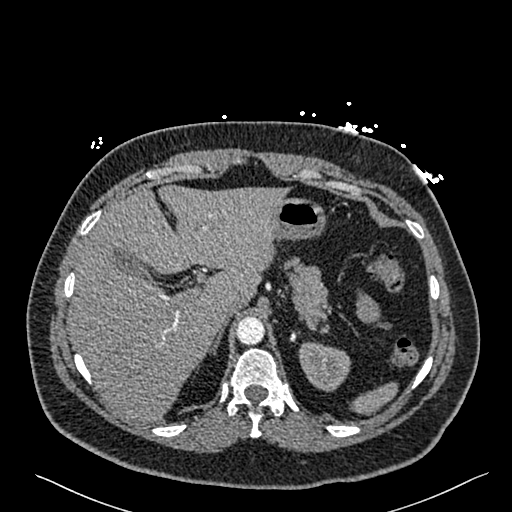
[im 102/326  lung]
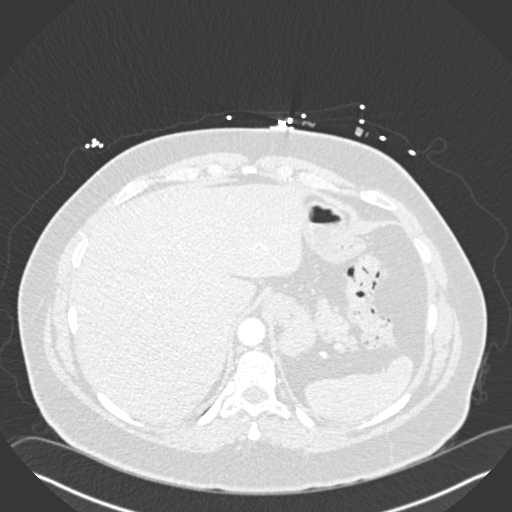
[im 122/326  soft-tissue]
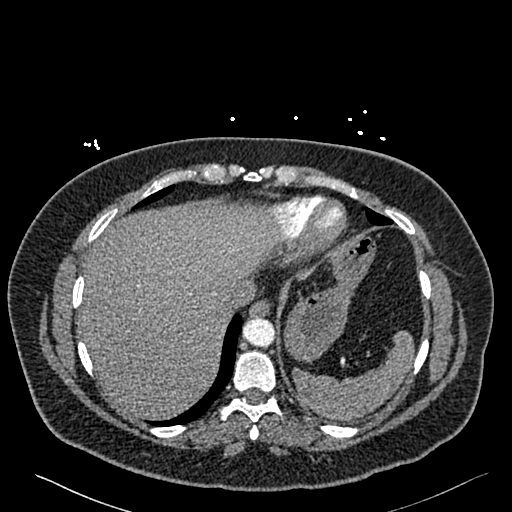
[im 143/326  lung]
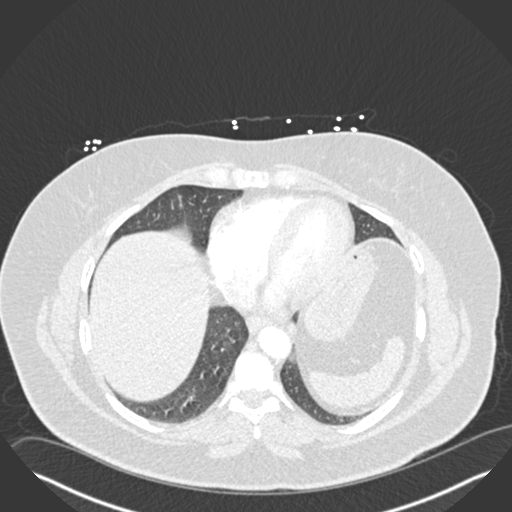
[im 163/326  soft-tissue]
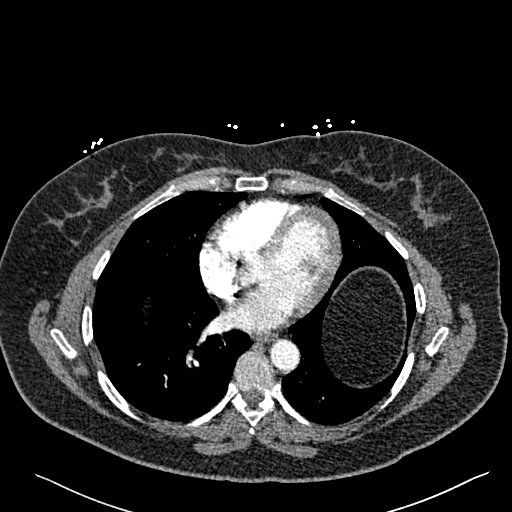
[im 183/326  lung]
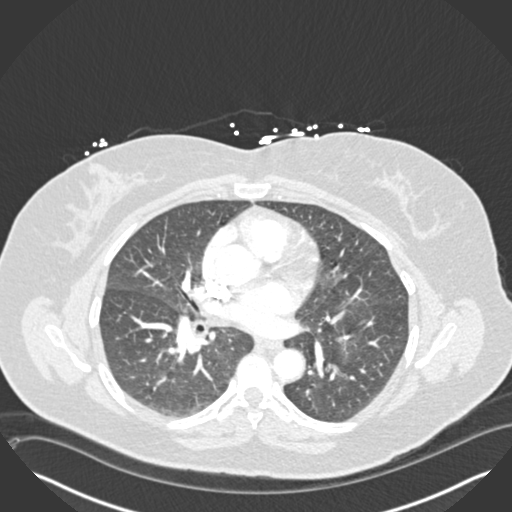
[im 204/326  soft-tissue]
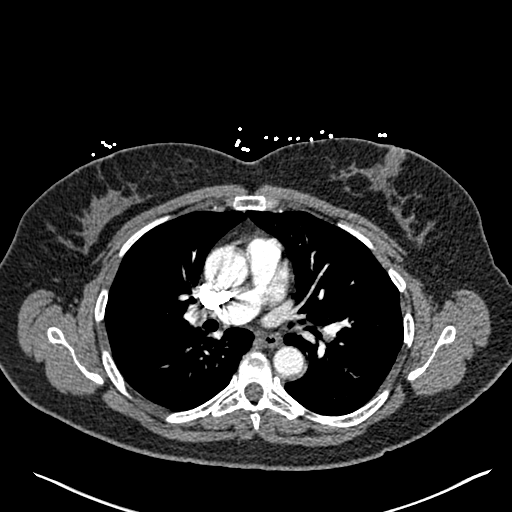
[im 224/326  lung]
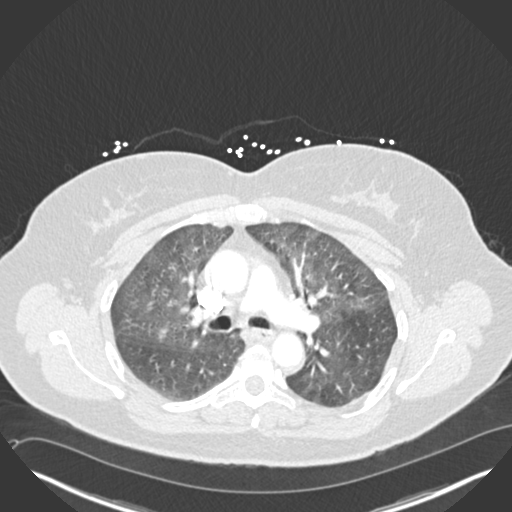
[im 244/326  soft-tissue]
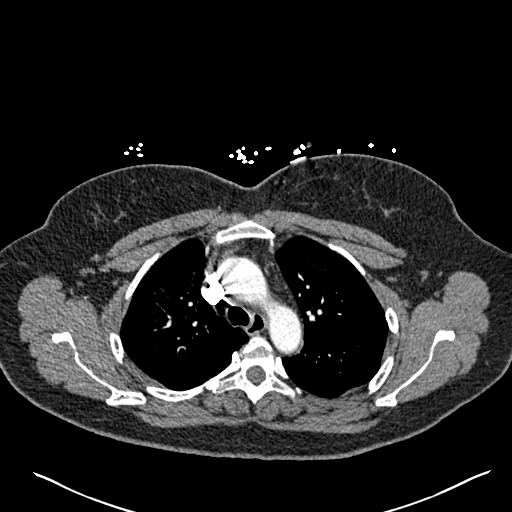
[im 265/326  lung]
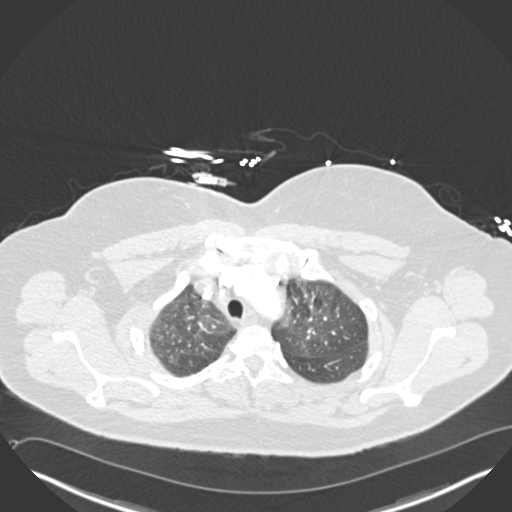
[im 285/326  soft-tissue]
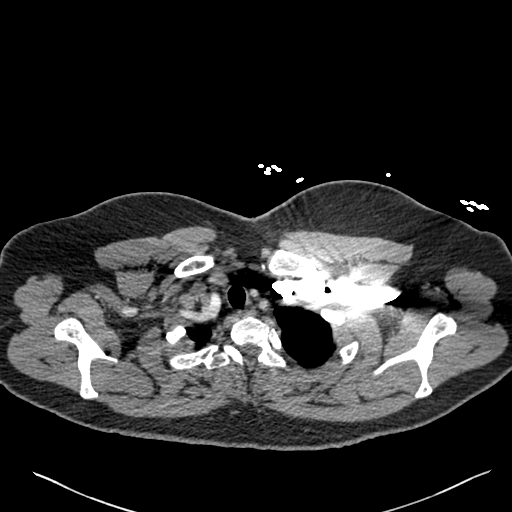
[im 305/326  lung]
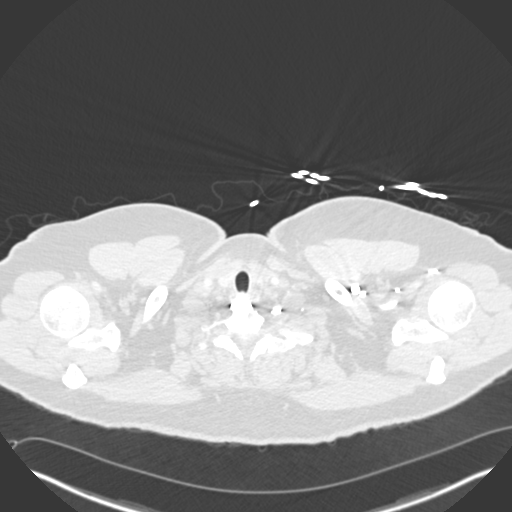

[Series 7: lung · axial · 0.79mm/px · z∈[-92,-52]mm · 2 of 163 slices shown]
[im 21/163  soft-tissue]
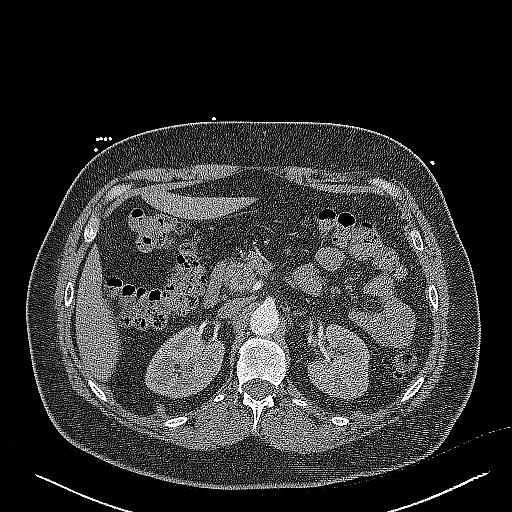
[im 41/163  soft-tissue]
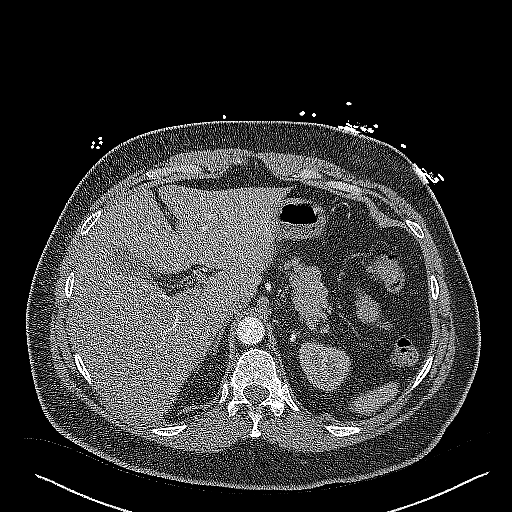

[Series 8: coronal mpr · coronal · 0.64mm/px · 2 of 99 slices shown]
[im 33/99  soft-tissue]
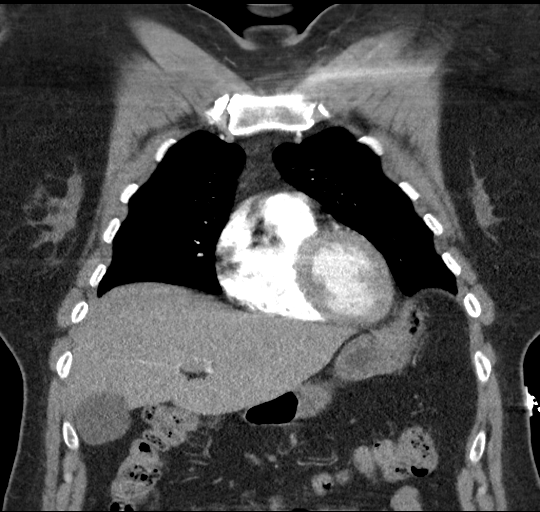
[im 66/99  soft-tissue]
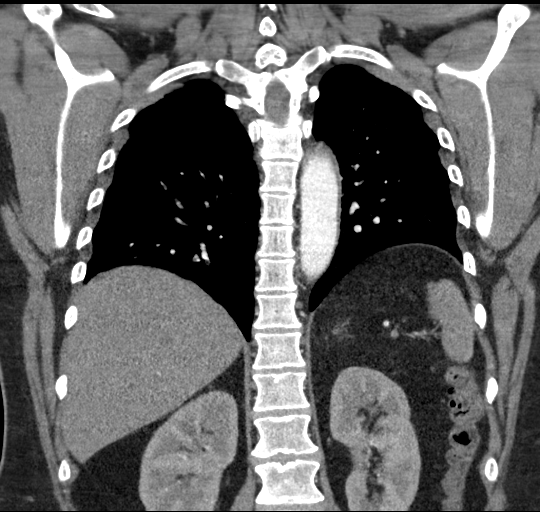

[19 of 46 positions shown; findings below may reference images not displayed]

FINDINGS: Cardiovascular: No filling defects within the pulmonary arteries to
suggest acute pulmonary embolism. No significant vascular findings.
Normal heart size. No pericardial effusion.

Mediastinum/Nodes: No axillary or supraclavicular adenopathy. No
mediastinal or hilar adenopathy. No pericardial fluid. Esophagus
normal.

Lungs/Pleura: Mild ground-glass opacities in the upper lobes. No
pulmonary infarction. No pneumonia. Airways normal.

Upper Abdomen: Limited view of the liver, kidneys, pancreas are
unremarkable. Normal adrenal glands.

Musculoskeletal: No aggressive osseous lesion.

Review of the MIP images confirms the above findings.
IMPRESSION: 1. No acute pulmonary embolism.
2. Upper lobe ground-glass opacities are nonspecific with common
differential including mild pulmonary edema versus atelectasis.
3. No pleural fluid.

## 2021-07-15 IMAGING — CR DG CHEST 2V
2 series · 2 of 2 positions shown · non-contrast
Comparison: Prior chest radiographs 06/15/2012 and earlier

CLINICAL DATA: Chest pain. Additional history provided: Patient
reports right greater than left ribs/chest pain and pain in back,
history of hypertension and hyperlipidemia.

EXAM:
CHEST - 2 VIEW

[w chest pa]
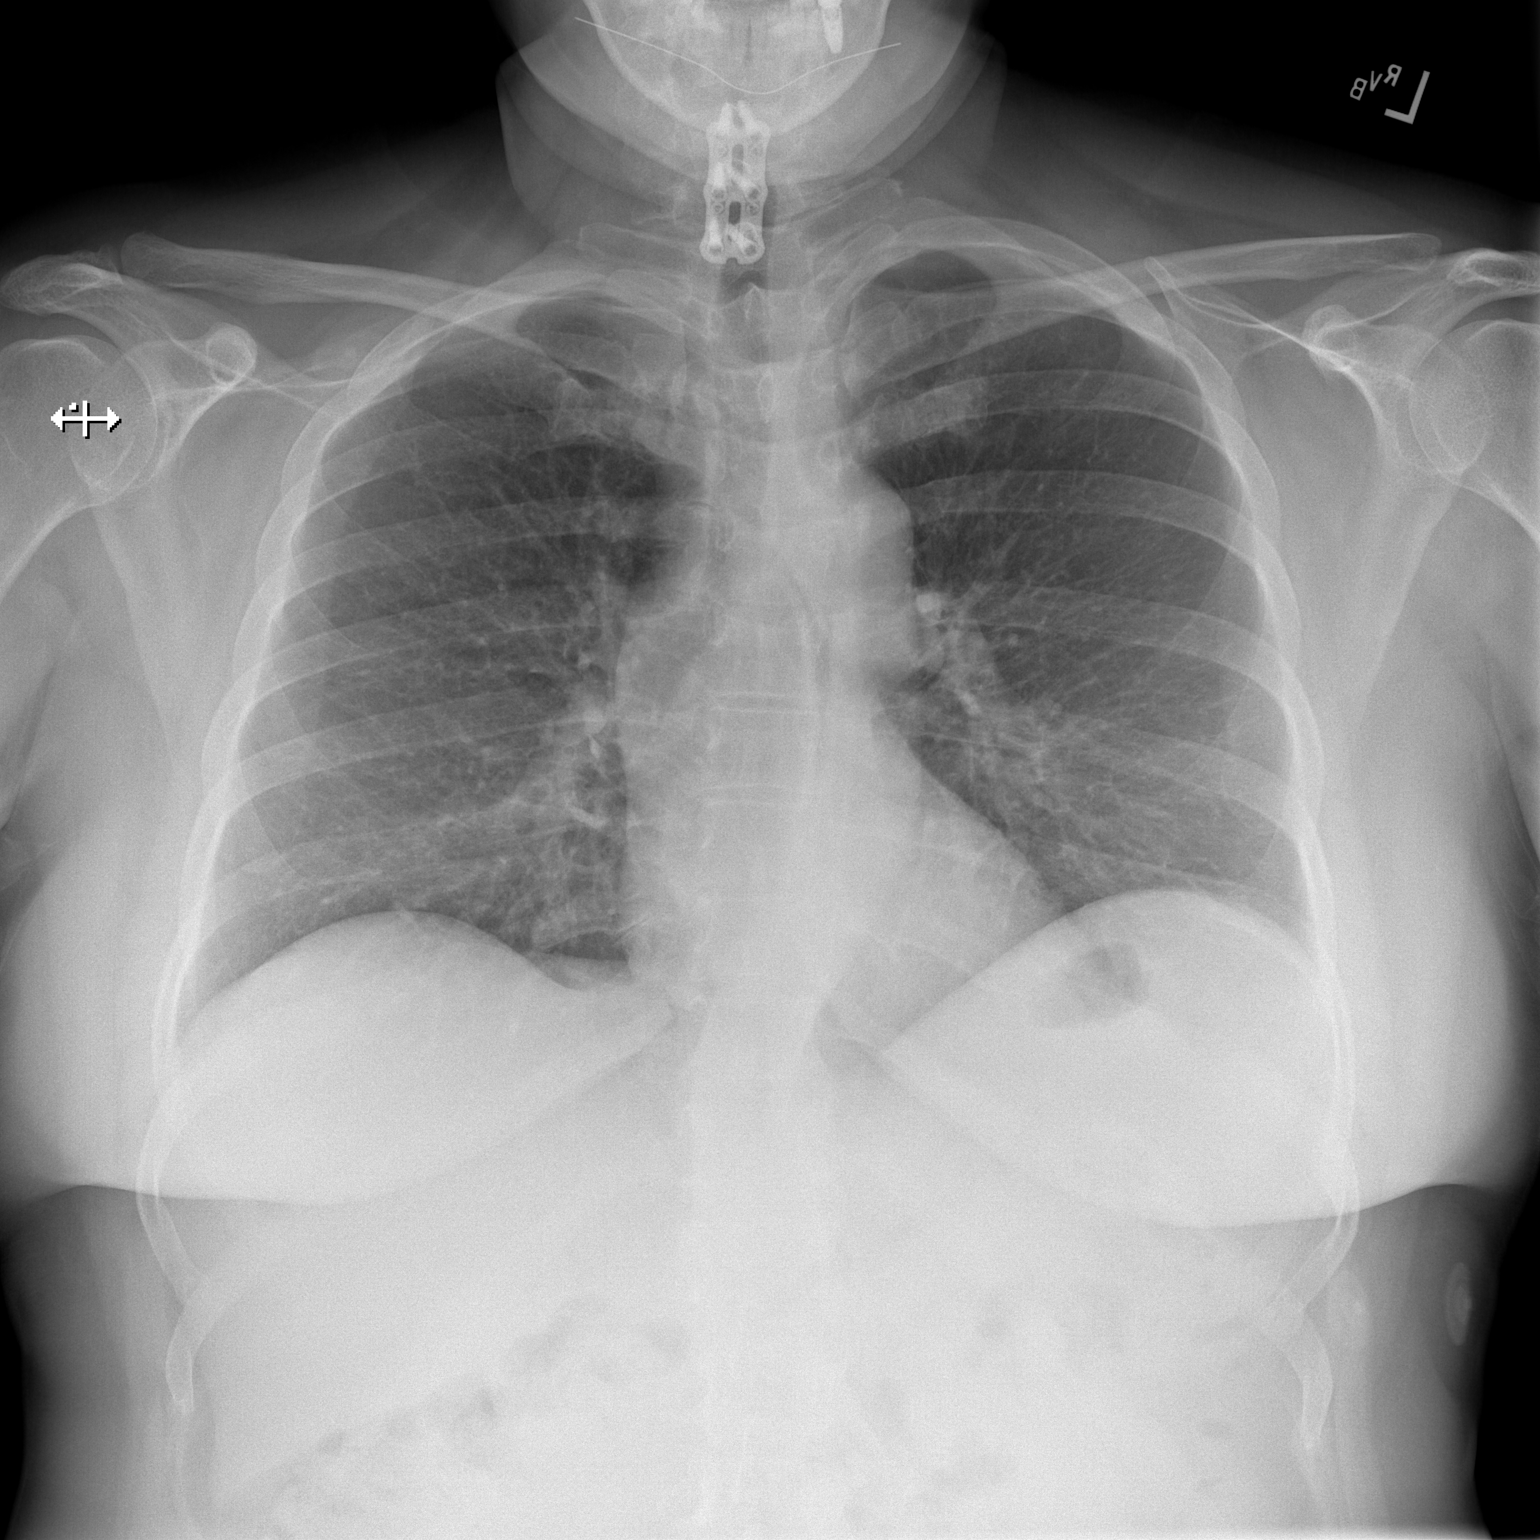

[w chest lat]
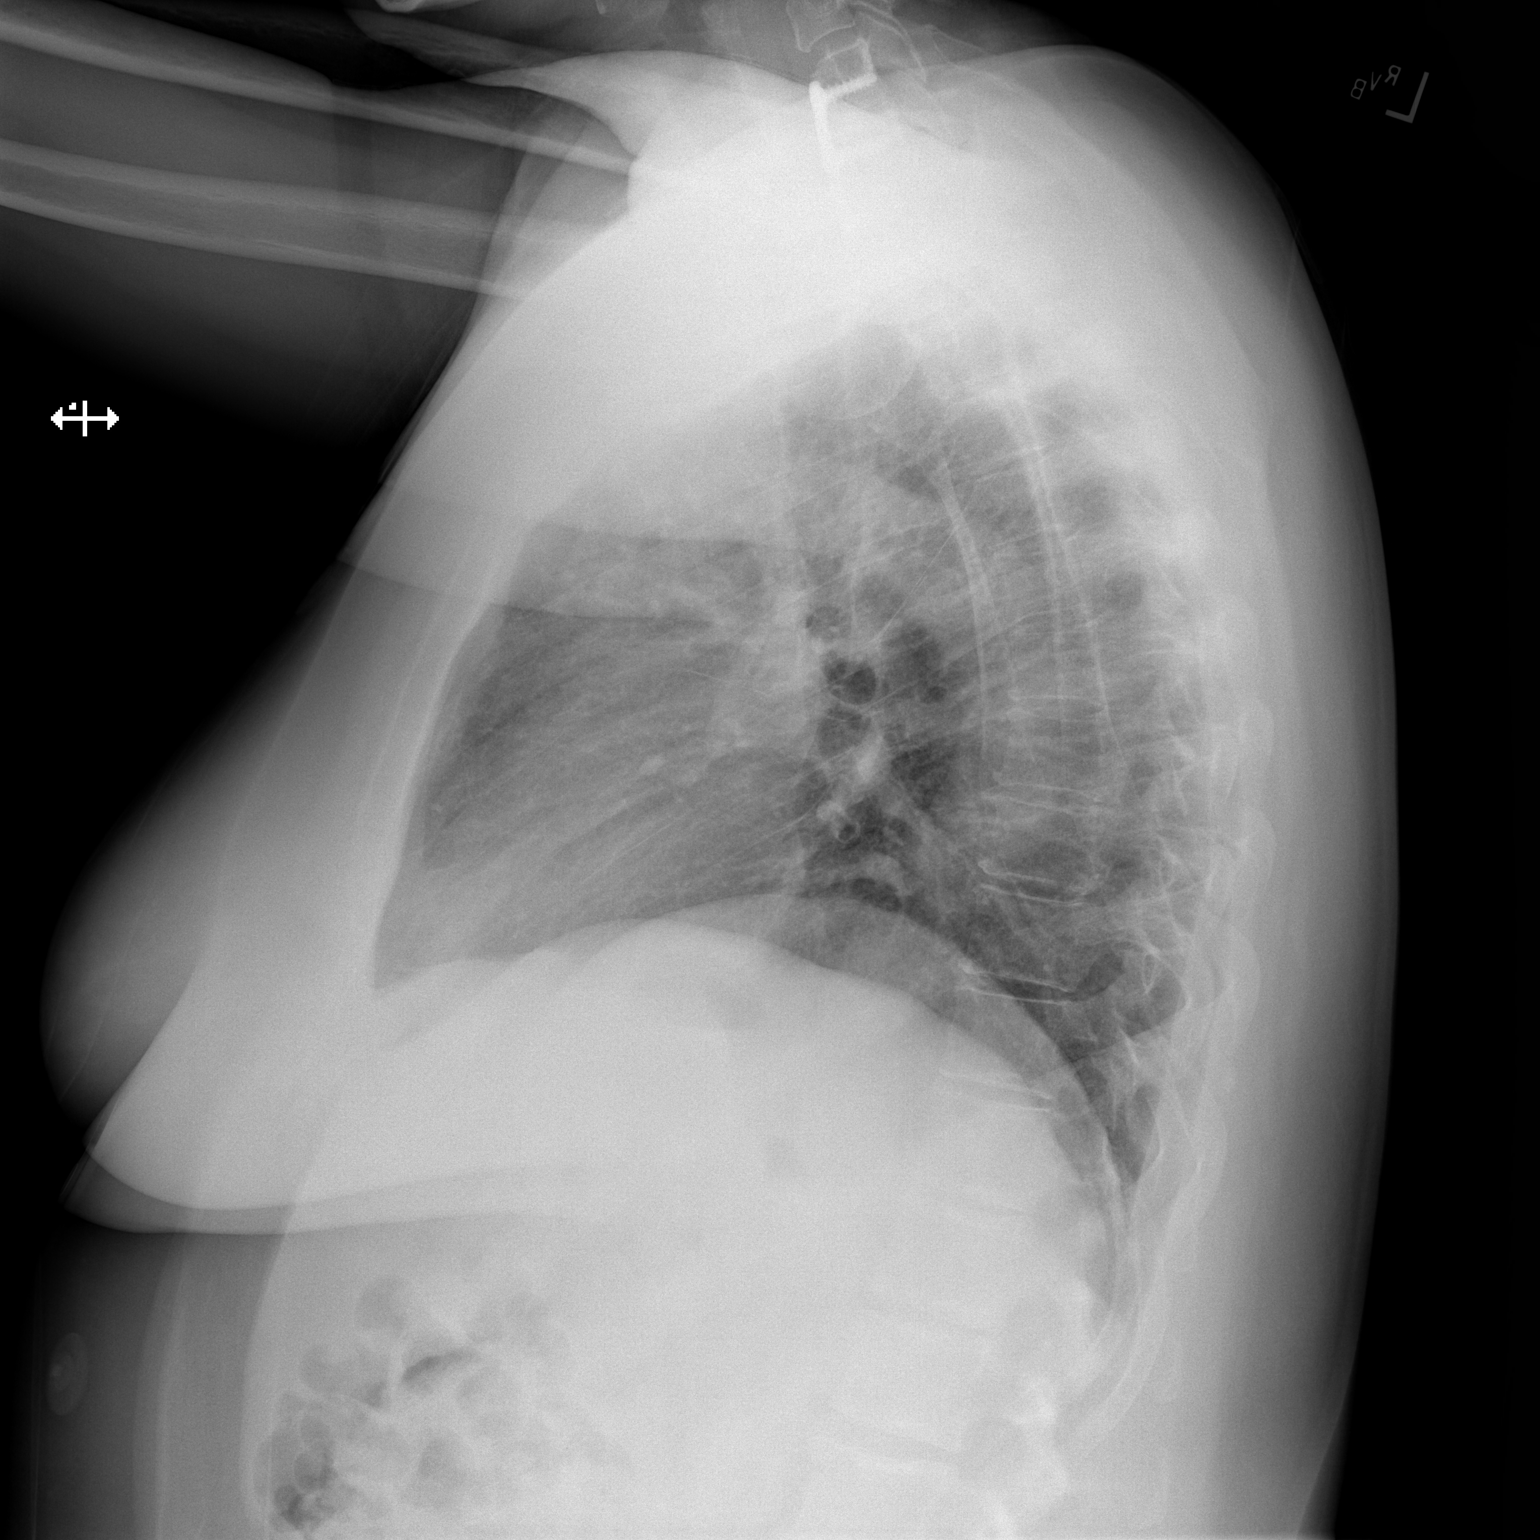

[2 of 2 positions shown; findings below may reference images not displayed]

FINDINGS: Heart size within normal limits. No appreciable airspace
consolidation or pulmonary edema. No evidence of pleural effusion or
pneumothorax. No acute bony abnormality identified. ACDF hardware
within the lower cervical spine.
IMPRESSION: No evidence of active cardiopulmonary disease.

## 2022-07-07 ENCOUNTER — Other Ambulatory Visit: Payer: Self-pay

## 2022-07-07 ENCOUNTER — Encounter (HOSPITAL_BASED_OUTPATIENT_CLINIC_OR_DEPARTMENT_OTHER): Payer: Self-pay

## 2022-07-07 ENCOUNTER — Emergency Department (HOSPITAL_BASED_OUTPATIENT_CLINIC_OR_DEPARTMENT_OTHER)
Admission: EM | Admit: 2022-07-07 | Discharge: 2022-07-07 | Disposition: A | Discharge status: Home / Self Care | Payer: BLUE CROSS/BLUE SHIELD | Attending: Emergency Medicine | Admitting: Emergency Medicine

## 2022-07-07 ENCOUNTER — Emergency Department (HOSPITAL_BASED_OUTPATIENT_CLINIC_OR_DEPARTMENT_OTHER): Payer: BLUE CROSS/BLUE SHIELD

## 2022-07-07 DIAGNOSIS — R109 Unspecified abdominal pain: Secondary | ICD-10-CM

## 2022-07-07 DIAGNOSIS — J029 Acute pharyngitis, unspecified: Secondary | ICD-10-CM | POA: Insufficient documentation

## 2022-07-07 DIAGNOSIS — N10 Acute tubulo-interstitial nephritis: Secondary | ICD-10-CM | POA: Diagnosis not present

## 2022-07-07 DIAGNOSIS — Z20822 Contact with and (suspected) exposure to covid-19: Secondary | ICD-10-CM | POA: Insufficient documentation

## 2022-07-07 DIAGNOSIS — R300 Dysuria: Secondary | ICD-10-CM | POA: Diagnosis present

## 2022-07-07 DIAGNOSIS — R059 Cough, unspecified: Secondary | ICD-10-CM | POA: Diagnosis not present

## 2022-07-07 DIAGNOSIS — R112 Nausea with vomiting, unspecified: Secondary | ICD-10-CM | POA: Insufficient documentation

## 2022-07-07 LAB — RESP PANEL BY RT-PCR (FLU A&B, COVID) ARPGX2
Influenza A by PCR: NEGATIVE
Influenza B by PCR: NEGATIVE
SARS Coronavirus 2 by RT PCR: NEGATIVE

## 2022-07-07 LAB — COMPREHENSIVE METABOLIC PANEL
ALT: 39 U/L (ref 0–44)
AST: 19 U/L (ref 15–41)
Albumin: 4 g/dL (ref 3.5–5.0)
Alkaline Phosphatase: 61 U/L (ref 38–126)
Anion gap: 11 (ref 5–15)
BUN: 15 mg/dL (ref 6–20)
CO2: 26 mmol/L (ref 22–32)
Calcium: 9.2 mg/dL (ref 8.9–10.3)
Chloride: 100 mmol/L (ref 98–111)
Creatinine, Ser: 0.93 mg/dL (ref 0.44–1.00)
GFR, Estimated: >60 mL/min (ref >60)
Glucose, Bld: 99 mg/dL (ref 70–99)
Potassium: 3.7 mmol/L (ref 3.5–5.1)
Sodium: 137 mmol/L (ref 135–145)
Total Bilirubin: 0.8 mg/dL (ref 0.3–1.2)
Total Protein: 7.1 g/dL (ref 6.5–8.1)

## 2022-07-07 LAB — URINALYSIS, ROUTINE W REFLEX MICROSCOPIC
Bilirubin Urine: NEGATIVE
Glucose, UA: NEGATIVE mg/dL
Hgb urine dipstick: NEGATIVE
Ketones, ur: NEGATIVE mg/dL
Leukocytes,Ua: NEGATIVE
Nitrite: NEGATIVE
Protein, ur: NEGATIVE mg/dL
Specific Gravity, Urine: 1.016 (ref 1.005–1.030)
pH: 6 (ref 5.0–8.0)

## 2022-07-07 LAB — CBC
HCT: 42.3 % (ref 36.0–46.0)
Hemoglobin: 14.5 g/dL (ref 12.0–15.0)
MCH: 30.9 pg (ref 26.0–34.0)
MCHC: 34.3 g/dL (ref 30.0–36.0)
MCV: 90.2 fL (ref 80.0–100.0)
Platelets: 268 10*3/uL (ref 150–400)
RBC: 4.69 MIL/uL (ref 3.87–5.11)
RDW: 12.1 % (ref 11.5–15.5)
WBC: 16.8 10*3/uL — ABNORMAL HIGH (ref 4.0–10.5)
nRBC: 0 % (ref 0.0–0.2)

## 2022-07-07 LAB — PREGNANCY, URINE: Preg Test, Ur: NEGATIVE

## 2022-07-07 MED ORDER — CEFDINIR 300 MG PO CAPS: 300.0000 mg | ORAL_CAPSULE | Freq: Two times a day (BID) | ORAL | 0 refills | Status: AC

## 2022-07-07 MED ORDER — SODIUM CHLORIDE 0.9 % IV SOLN
1.0000 g | Freq: Once | INTRAVENOUS | Status: AC
  Administered 2022-07-07: 1 g via INTRAVENOUS
  Filled 2022-07-07: qty 10

## 2022-07-07 MED ORDER — LACTATED RINGERS IV BOLUS
1000.0000 mL | Freq: Once | INTRAVENOUS | Status: AC
  Administered 2022-07-07: 1000 mL via INTRAVENOUS

## 2022-07-07 MED ORDER — ONDANSETRON HCL 4 MG/2ML IJ SOLN
4.0000 mg | Freq: Once | INTRAMUSCULAR | Status: AC
  Administered 2022-07-07: 4 mg via INTRAVENOUS
  Filled 2022-07-07: qty 2

## 2022-07-07 MED ORDER — HYDROMORPHONE HCL 1 MG/ML IJ SOLN
0.5000 mg | Freq: Once | INTRAMUSCULAR | Status: AC
  Administered 2022-07-07: 0.5 mg via INTRAVENOUS
  Filled 2022-07-07: qty 1

## 2022-07-07 MED ORDER — SODIUM CHLORIDE 0.9 % IV BOLUS
1000.0000 mL | Freq: Once | INTRAVENOUS | Status: AC
  Administered 2022-07-07: 1000 mL via INTRAVENOUS

## 2022-07-07 MED ORDER — IOHEXOL 300 MG/ML  SOLN
100.0000 mL | Freq: Once | INTRAMUSCULAR | Status: AC | PRN
  Administered 2022-07-07: 80 mL via INTRAVENOUS

## 2022-07-07 MED ORDER — TRAMADOL HCL 50 MG PO TABS: 50.0000 mg | ORAL_TABLET | Freq: Four times a day (QID) | ORAL | 0 refills | Status: AC | PRN

## 2022-07-07 MED ORDER — ONDANSETRON 8 MG PO TBDP: 8.0000 mg | ORAL_TABLET | Freq: Three times a day (TID) | ORAL | 0 refills | Status: AC | PRN

## 2022-07-07 NOTE — ED Notes (Signed)
Pt transported to CT ?

## 2022-07-07 NOTE — ED Notes (Signed)
Provider at bedside

## 2022-07-07 NOTE — ED Provider Notes (Signed)
[facility]   CSN: [id number] Arrival date & time: 07/07/22  1519     History  Chief Complaint  Patient presents with   Dysuria    Olivia SlovakCynthia A Riley is a 51 y.o. female.  Pt c/o dysuria a week ago. Was placed on abx for same yesterday ?macrobid, took total of two doses.  Dysuria mildly improfed, and also notes increased back/body pain, nausea/vomiting. No bloody or bilious emesis. No 'anterior' pain, no abd or pelvic pain. No vaginal discharge or bleeding. No abd distension. +non prod cough in past day. No sore throat. No sob. No chest pain. Subjective fever.   The history is provided by the patient and medical records.  Dysuria Associated symptoms: fever, flank pain, nausea and vomiting   Associated symptoms: no abdominal pain        Home Medications Prior to Admission medications   Medication Sig Start Date End Date Taking? Authorizing Provider  cefdinir (OMNICEF) 300 MG capsule Take 1 capsule (300 mg total) by mouth 2 (two) times daily. 07/07/22  Yes Cathren LaineSteinl, Kadan Millstein, MD  ondansetron (ZOFRAN-ODT) 8 MG disintegrating tablet Take 1 tablet (8 mg total) by mouth every 8 (eight) hours as needed for nausea or vomiting. 07/07/22  Yes Cathren LaineSteinl, Alisse Tuite, MD  traMADol (ULTRAM) 50 MG tablet Take 1 tablet (50 mg total) by mouth every 6 (six) hours as needed. 07/07/22  Yes Cathren LaineSteinl, Thaila Bottoms, MD  amLODipine (NORVASC) 2.5 MG tablet Take 1 tablet (2.5 mg total) by mouth daily. APPOINTMENT IS NEEDED FOR MORE REFILLS 03/24/16   Rollene RotundaHochrein, James, MD  cetirizine (ZYRTEC) 10 MG tablet Take 20 mg by mouth as needed for allergies.    [provider]  cimetidine (TAGAMET) 800 MG tablet Take 1 tablet by mouth daily. 03/31/14   [provider]  cloNIDine (CATAPRES) 0.1 MG tablet Take 0.1 mg by mouth daily.    [provider]  DULoxetine (CYMBALTA) 30 MG capsule Take 30 mg by mouth daily. 02/01/20   [provider]  DULoxetine (CYMBALTA) 60  MG capsule Take 60 mg by mouth daily. 02/01/20   [provider]  EPINEPHrine (AUVI-Q) 0.3 mg/0.3 mL IJ SOAJ injection Inject into the muscle once.    [provider]  FLUoxetine (PROZAC) 20 MG tablet Take 1 tablet by mouth daily. 04/14/14   [provider]  hydrochlorothiazide (MICROZIDE) 12.5 MG capsule Take 1 capsule by mouth daily. 04/14/14   [provider]  levonorgestrel-ethinyl estradiol (LYBREL,AMETHYST) 90-20 MCG tablet Take 1 tablet by mouth daily.    [provider]  losartan (COZAAR) 100 MG tablet Take 100 mg by mouth daily. 02/09/20   [provider]  predniSONE (DELTASONE) 20 MG tablet Take 2 tablets (40 mg total) by mouth daily. 03/31/14   Benjiman CorePickering, Nathan, MD  terbinafine (LAMISIL) 250 MG tablet Take 1 tablet (250 mg total) by mouth daily. 01/24/14   Hyatt, Max T, DPM      Allergies    Flexeril [cyclobenzaprine] and Lisinopril    Review of Systems   Review of Systems  Constitutional:  Positive for fever.  HENT:  Negative for sore throat.   Eyes:  Negative for redness.  Respiratory:  Positive for cough. Negative for shortness of breath.   Cardiovascular:  Negative for chest pain and leg swelling.  Gastrointestinal:  Positive for nausea and vomiting. Negative for abdominal pain.  Genitourinary:  Positive for dysuria and flank pain.  Musculoskeletal:  Positive for back pain and myalgias. Negative for  neck pain.  Skin:  Negative for rash.  Neurological:  Negative for headaches.  Hematological:  Does not bruise/bleed easily.  Psychiatric/Behavioral:  Negative for confusion.     Physical Exam Updated Vital Signs BP 102/68   Pulse 80   Temp 98.2 F (36.8 C) (Oral)   Resp 18   Ht 1.702 m (5\' 7" )   Wt 90.7 kg   SpO2 96%   BMI 31.32 kg/m  Physical Exam Vitals and nursing note reviewed.  Constitutional:      Appearance: Normal appearance. She is well-developed.  HENT:     Head: Atraumatic.     Nose: Nose normal.      Mouth/Throat:     Mouth: Mucous membranes are moist.  Eyes:     General: No scleral icterus.    Conjunctiva/sclera: Conjunctivae normal.  Neck:     Trachea: No tracheal deviation.     Comments: No stiffness or rigidity.  Cardiovascular:     Rate and Rhythm: Normal rate and regular rhythm.     Pulses: Normal pulses.     Heart sounds: Normal heart sounds. No murmur heard.    No friction rub. No gallop.  Pulmonary:     Effort: Pulmonary effort is normal. No respiratory distress.     Breath sounds: Normal breath sounds.  Abdominal:     General: Bowel sounds are normal. There is no distension.     Palpations: Abdomen is soft. There is no mass.     Tenderness: There is abdominal tenderness. There is no guarding or rebound.     Hernia: No hernia is present.     Comments: Abd and flank tenderness.   Genitourinary:    Comments: ?mild cva tenderness.  Musculoskeletal:        General: No swelling.     Cervical back: Normal range of motion and neck supple. No rigidity. No muscular tenderness.     Comments: T/l/s spine non tender, aligned.   Skin:    General: Skin is warm and dry.     Findings: No rash.  Neurological:     Mental Status: She is alert.     Comments: Alert, speech normal. Motor/sens grossly intact bil, steady gait.   Psychiatric:        Mood and Affect: Mood normal.     ED Results / Procedures / Treatments   Labs (all labs ordered are listed, but only abnormal results are displayed) Results for orders placed or performed during the hospital encounter of 07/07/22  Resp Panel by RT-PCR (Flu A&B, Covid) Anterior Nasal Swab   Specimen: Anterior Nasal Swab  Result Value Ref Range   SARS Coronavirus 2 by RT PCR NEGATIVE NEGATIVE   Influenza A by PCR NEGATIVE NEGATIVE   Influenza B by PCR NEGATIVE NEGATIVE  Urinalysis, Routine w reflex microscopic Urine, Clean Catch  Result Value Ref Range   Color, Urine YELLOW YELLOW   APPearance CLEAR CLEAR   Specific Gravity, Urine  1.016 1.005 - 1.030   pH 6.0 5.0 - 8.0   Glucose, UA NEGATIVE NEGATIVE mg/dL   Hgb urine dipstick NEGATIVE NEGATIVE   Bilirubin Urine NEGATIVE NEGATIVE   Ketones, ur NEGATIVE NEGATIVE mg/dL   Protein, ur NEGATIVE NEGATIVE mg/dL   Nitrite NEGATIVE NEGATIVE   Leukocytes,Ua NEGATIVE NEGATIVE  CBC  Result Value Ref Range   WBC 16.8 (H) 4.0 - 10.5 K/uL   RBC 4.69 3.87 - 5.11 MIL/uL   Hemoglobin 14.5 12.0 - 15.0 g/dL   HCT 42.3  36.0 - 46.0 %   MCV 90.2 80.0 - 100.0 fL   MCH 30.9 26.0 - 34.0 pg   MCHC 34.3 30.0 - 36.0 g/dL   RDW 59.5 63.8 - 75.6 %   Platelets 268 150 - 400 K/uL   nRBC 0.0 0.0 - 0.2 %  Comprehensive metabolic panel  Result Value Ref Range   Sodium 137 135 - 145 mmol/L   Potassium 3.7 3.5 - 5.1 mmol/L   Chloride 100 98 - 111 mmol/L   CO2 26 22 - 32 mmol/L   Glucose, Bld 99 70 - 99 mg/dL   BUN 15 6 - 20 mg/dL   Creatinine, Ser 4.33 0.44 - 1.00 mg/dL   Calcium 9.2 8.9 - 29.5 mg/dL   Total Protein 7.1 6.5 - 8.1 g/dL   Albumin 4.0 3.5 - 5.0 g/dL   AST 19 15 - 41 U/L   ALT 39 0 - 44 U/L   Alkaline Phosphatase 61 38 - 126 U/L   Total Bilirubin 0.8 0.3 - 1.2 mg/dL   GFR, Estimated >18 >84 mL/min   Anion gap 11 5 - 15  Pregnancy, urine  Result Value Ref Range   Preg Test, Ur NEGATIVE NEGATIVE    EKG None  Radiology CT Abdomen Pelvis W Contrast  Result Date: 07/07/2022 CLINICAL DATA:  Abdominal pain EXAM: CT ABDOMEN AND PELVIS WITH CONTRAST TECHNIQUE: Multidetector CT imaging of the abdomen and pelvis was performed using the standard protocol following bolus administration of intravenous contrast. RADIATION DOSE REDUCTION: This exam was performed according to the departmental dose-optimization program which includes automated exposure control, adjustment of the mA and/or kV according to patient size and/or use of iterative reconstruction technique. CONTRAST:  68mL OMNIPAQUE IOHEXOL 300 MG/ML  SOLN COMPARISON:  None Available. FINDINGS: Lower chest: Elevation of the  left hemidiaphragm with associated atelectasis no acute abnormality. Hepatobiliary: No focal liver abnormality is seen. No gallstones, gallbladder wall thickening, or biliary dilatation. Pancreas: Unremarkable. No pancreatic ductal dilatation or surrounding inflammatory changes. Spleen: Normal in size without focal abnormality. Adrenals/Urinary Tract: Bilateral adrenal glands are unremarkable. No hydronephrosis or nephrolithiasis. Heterogeneous enhancement of the bilateral kidneys, best appreciated on delayed imaging, and mild perirenal fat stranding. Stomach/Bowel: Stomach is within normal limits. Appendix appears normal. Mild diverticulosis no evidence of bowel wall thickening, distention, or inflammatory changes. Vascular/Lymphatic: Mild aortic atherosclerosis. No enlarged abdominal or pelvic lymph nodes. Reproductive: Fibroid uterus.  No adnexal mass Other: No abdominal wall hernia or abnormality. No abdominopelvic ascites. 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 No aggressive appearing osseous lesions. IMPRESSION: 1. Heterogeneous enhancement of the bilateral kidneys, findings are consistent with pyelonephritis. 2. Mild aortic Atherosclerosis (ICD10-I70.0). Electronically Signed   By: Allegra Lai M.D.   On: 07/07/2022 20:25    Procedures Procedures    Medications Ordered in ED Medications  sodium chloride 0.9 % bolus 1,000 mL (0 mLs Intravenous Stopped 07/07/22 1950)  HYDROmorphone (DILAUDID) injection 0.5 mg (0.5 mg Intravenous Given 07/07/22 1840)  ondansetron (ZOFRAN) injection 4 mg (4 mg Intravenous Given 07/07/22 1840)  iohexol (OMNIPAQUE) 300 MG/ML solution 100 mL (80 mLs Intravenous Contrast Given 07/07/22 1954)  cefTRIAXone (ROCEPHIN) 1 g in sodium chloride 0.9 % 100 mL IVPB (0 g Intravenous Stopped 07/07/22 2121)  lactated ringers bolus 1,000 mL (0 mLs Intravenous Stopped 07/07/22 2212)    ED Course/ Medical Decision Making/  A&P  Medical Decision Making Problems Addressed: Acute pyelonephritis: acute illness or injury with systemic symptoms that poses a threat to life or bodily functions Flank pain: acute illness or injury  Amount and/or Complexity of Data Reviewed External Data Reviewed: notes. Labs: ordered. Decision-making details documented in ED Course. Radiology: ordered and independent interpretation performed. Decision-making details documented in ED Course.  Risk Prescription drug management. Parenteral controlled substances. Decision regarding hospitalization.   Iv ns. Continuous pulse ox and cardiac monitoring. Labs ordered/sent. Imaging ordered.   Diff dx includes uti, pyelo, diverticulitis, non spec flank/back pain, aki, etc - dispo decision including potential need for admission considered - will check labs and imaging and reassess.   Reviewed nursing notes and prior charts for additional history. External reports reviewed.   Cardiac monitor: sinus rhythm, rate 92.  Labs reviewed/interpreted by me - UA neg for uti (pt on abx for 1 day)  Ns bolus. Dilaudid iv. Zofran iv.   CT reviewed/interpreted by me - ?pyelonephritis.   Rocephin iv. Ivf.   Pain improved. No nv.  Vitals normal  Pt currently appears stable for d/c.   Rec close pcp f/u.  Return precautions provided.            Final Clinical Impression(s) / ED Diagnoses Final diagnoses:  Acute pyelonephritis  Flank pain    Rx / DC Orders ED Discharge Orders          Ordered    cefdinir (OMNICEF) 300 MG capsule  2 times daily        07/07/22 2236    traMADol (ULTRAM) 50 MG tablet  Every 6 hours PRN        07/07/22 2236    ondansetron (ZOFRAN-ODT) 8 MG disintegrating tablet  Every 8 hours PRN        07/07/22 2236              Cathren Laine, MD 07/07/22 2239

## 2022-07-07 NOTE — ED Triage Notes (Signed)
Pt states she developed uti symptoms on Thanksgiving night.   Was started on Macrobid yesterday and the pain has worsened.   C/o back pain, nausea and vomiting

## 2022-07-07 NOTE — Discharge Instructions (Addendum)
It was our pleasure to provide your ER care today - we hope that you feel better.  Drink plenty of fluids/stay well hydrated.   Take omnicef (antibiotic) as prescribed.   Take acetaminophen or ibuprofen as need for pain. You may also take zofran as need for nausea and ultram as need for pain - no driving when taking.   Follow up with primary care doctor in the next few days.   Return to ER right if worse, new symptoms, fevers, new or worsening or severe pain, persistent vomiting, or other concern.   You were given pain meds in the ER - no driving for the next 6 hours.

## 2022-07-09 LAB — URINE CULTURE: Special Requests: NORMAL
